# Patient Record
Sex: Male | Born: 1935
Health system: Southern US, Community
[De-identification: ages and names within clinical notes are randomized; demographics above are authoritative.]

## PROBLEM LIST (undated history)

## (undated) DIAGNOSIS — M1990 Unspecified osteoarthritis, unspecified site: Secondary | ICD-10-CM

## (undated) DIAGNOSIS — G4730 Sleep apnea, unspecified: Secondary | ICD-10-CM

## (undated) DIAGNOSIS — M545 Low back pain, unspecified: Secondary | ICD-10-CM

## (undated) DIAGNOSIS — R42 Dizziness and giddiness: Secondary | ICD-10-CM

## (undated) DIAGNOSIS — I10 Essential (primary) hypertension: Secondary | ICD-10-CM

## (undated) DIAGNOSIS — I639 Cerebral infarction, unspecified: Secondary | ICD-10-CM

## (undated) DIAGNOSIS — G8929 Other chronic pain: Secondary | ICD-10-CM

## (undated) DIAGNOSIS — K219 Gastro-esophageal reflux disease without esophagitis: Secondary | ICD-10-CM

## (undated) DIAGNOSIS — E7800 Pure hypercholesterolemia, unspecified: Secondary | ICD-10-CM

## (undated) DIAGNOSIS — H9190 Unspecified hearing loss, unspecified ear: Secondary | ICD-10-CM

## (undated) DIAGNOSIS — N400 Enlarged prostate without lower urinary tract symptoms: Secondary | ICD-10-CM

## (undated) DIAGNOSIS — Z87442 Personal history of urinary calculi: Secondary | ICD-10-CM

## (undated) HISTORY — DX: Pure hypercholesterolemia, unspecified: E78.00

## (undated) HISTORY — PX: LITHOTRIPSY: SUR834

## (undated) HISTORY — DX: Unspecified hearing loss, unspecified ear: H91.90

## (undated) HISTORY — DX: Gastro-esophageal reflux disease without esophagitis: K21.9

## (undated) HISTORY — DX: Dizziness and giddiness: R42

## (undated) HISTORY — PX: STOMACH SURGERY: SHX791

## (undated) HISTORY — DX: Benign prostatic hyperplasia without lower urinary tract symptoms: N40.0

---
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
## 1977-11-22 DIAGNOSIS — I639 Cerebral infarction, unspecified: Secondary | ICD-10-CM

## 1977-11-22 HISTORY — PX: REPAIR OF PERFORATED ULCER: SHX6065

## 1977-11-22 HISTORY — DX: Cerebral infarction, unspecified: I63.9

## 2002-11-22 HISTORY — PX: NASAL SINUS SURGERY: SHX719

## 2009-11-22 HISTORY — PX: BACK SURGERY: SHX140

## 2009-11-22 HISTORY — PX: POSTERIOR LAMINECTOMY / DECOMPRESSION LUMBAR SPINE: SUR740

## 2010-01-14 ENCOUNTER — Encounter: Admission: RE | Admit: 2010-01-14 | Discharge: 2010-01-14 | Payer: Self-pay | Admitting: Orthopedic Surgery

## 2010-04-15 ENCOUNTER — Ambulatory Visit (HOSPITAL_COMMUNITY): Admission: RE | Admit: 2010-04-15 | Discharge: 2010-04-16 | Payer: Self-pay | Admitting: Orthopedic Surgery

## 2010-04-15 HISTORY — PX: CARPAL TUNNEL RELEASE: SHX101

## 2011-02-08 LAB — POCT I-STAT 4, (NA,K, GLUC, HGB,HCT)
Glucose, Bld: 97 mg/dL (ref 70–99)
HCT: 36 % — ABNORMAL LOW (ref 39.0–52.0)
Hemoglobin: 12.2 g/dL — ABNORMAL LOW (ref 13.0–17.0)
Potassium: 4.2 mEq/L (ref 3.5–5.1)
Sodium: 138 mEq/L (ref 135–145)

## 2011-09-17 ENCOUNTER — Other Ambulatory Visit: Payer: Self-pay | Admitting: Orthopedic Surgery

## 2011-09-17 DIAGNOSIS — M48061 Spinal stenosis, lumbar region without neurogenic claudication: Secondary | ICD-10-CM

## 2011-09-30 ENCOUNTER — Ambulatory Visit
Admission: RE | Admit: 2011-09-30 | Discharge: 2011-09-30 | Disposition: A | Discharge status: Home / Self Care | Payer: Medicare Other | Source: Ambulatory Visit | Attending: Orthopedic Surgery | Admitting: Orthopedic Surgery

## 2011-09-30 DIAGNOSIS — M48061 Spinal stenosis, lumbar region without neurogenic claudication: Secondary | ICD-10-CM

## 2011-09-30 MED ORDER — IOHEXOL 180 MG/ML  SOLN
15.0000 mL | Freq: Once | INTRAMUSCULAR | Status: AC | PRN
  Administered 2011-09-30: 15 mL via INTRAVENOUS

## 2011-09-30 MED ORDER — DIAZEPAM 5 MG PO TABS
5.0000 mg | ORAL_TABLET | Freq: Once | ORAL | Status: AC
  Administered 2011-09-30: 5 mg via ORAL

## 2011-09-30 MED ORDER — ONDANSETRON HCL 4 MG/2ML IJ SOLN: 4.0000 mg | Freq: Four times a day (QID) | INTRAMUSCULAR | Status: DC | PRN

## 2011-09-30 MED ORDER — OXYCODONE-ACETAMINOPHEN 5-325 MG PO TABS
1.0000 | ORAL_TABLET | Freq: Once | ORAL | Status: AC
  Administered 2011-09-30: 1 via ORAL

## 2011-09-30 NOTE — Patient Instructions (Signed)

## 2013-04-28 DIAGNOSIS — M5137 Other intervertebral disc degeneration, lumbosacral region: Secondary | ICD-10-CM | POA: Insufficient documentation

## 2013-04-28 DIAGNOSIS — M51379 Other intervertebral disc degeneration, lumbosacral region without mention of lumbar back pain or lower extremity pain: Secondary | ICD-10-CM

## 2013-04-28 HISTORY — DX: Other intervertebral disc degeneration, lumbosacral region without mention of lumbar back pain or lower extremity pain: M51.379

## 2013-05-02 ENCOUNTER — Encounter (HOSPITAL_COMMUNITY): Payer: Self-pay | Admitting: Pharmacy Technician

## 2013-05-08 ENCOUNTER — Encounter (HOSPITAL_COMMUNITY): Payer: Self-pay

## 2013-05-08 ENCOUNTER — Encounter (HOSPITAL_COMMUNITY)
Admission: RE | Admit: 2013-05-08 | Discharge: 2013-05-08 | Disposition: A | Discharge status: Home / Self Care | Payer: Medicare Other | Source: Ambulatory Visit | Attending: Orthopedic Surgery | Admitting: Orthopedic Surgery

## 2013-05-08 HISTORY — DX: Unspecified osteoarthritis, unspecified site: M19.90

## 2013-05-08 HISTORY — DX: Cerebral infarction, unspecified: I63.9

## 2013-05-08 HISTORY — DX: Essential (primary) hypertension: I10

## 2013-05-08 LAB — CBC
HCT: 41.2 % (ref 39.0–52.0)
Hemoglobin: 14.1 g/dL (ref 13.0–17.0)
MCV: 88.8 fL (ref 78.0–100.0)
RDW: 14 % (ref 11.5–15.5)
WBC: 6.8 10*3/uL (ref 4.0–10.5)

## 2013-05-08 LAB — SURGICAL PCR SCREEN: Staphylococcus aureus: NEGATIVE

## 2013-05-08 LAB — BASIC METABOLIC PANEL
BUN: 17 mg/dL (ref 6–23)
CO2: 22 mEq/L (ref 19–32)
Chloride: 106 mEq/L (ref 96–112)
Creatinine, Ser: 0.85 mg/dL (ref 0.50–1.35)

## 2013-05-08 NOTE — Pre-Procedure Instructions (Signed)
KHOLE BRANCH  05/08/2013   Your procedure is scheduled on:  Thursday May 10, 2013  Report to [facility] at 1230 PM.  Call this number if you have problems the morning of surgery: [phone]   Remember:   Do not eat food or drink liquids after midnight Wednesday   Take these medicines the morning of surgery with A SIP OF WATER: Amlodipine, and Metoprolol,   Do not wear jewelry.  Do not wear lotions, powders, or perfumes. You may wear deodorant.  Do not shave 48 hours prior to surgery. Men may shave face and neck.  Do not bring valuables to the hospital.  [facility] is not responsible                   for any belongings or valuables.  Contacts, dentures or bridgework may not be worn into surgery.  Leave suitcase in the car. After surgery it may be brought to your room.  For patients admitted to the hospital, checkout time is 11:00 AM the day of  discharge.   Patients discharged the day of surgery will not be allowed to drive  home.    Special Instructions: Shower using CHG 2 nights before surgery and the night before surgery.  If you shower the day of surgery use CHG.  Use special wash - you have one bottle of CHG for all showers.  You should use approximately 1/3 of the bottle for each shower.   Please read over the following fact sheets that you were given: Pain Booklet, Coughing and Deep Breathing, MRSA Information and Surgical Site Infection Prevention

## 2013-05-09 ENCOUNTER — Other Ambulatory Visit: Payer: Self-pay | Admitting: Orthopedic Surgery

## 2013-05-09 NOTE — Progress Notes (Signed)
Called Dr. Laverta Baltimore office, spoke with [facility].  Orders requested to be placed in EPIC.

## 2013-05-10 ENCOUNTER — Encounter (HOSPITAL_COMMUNITY): Payer: Self-pay | Admitting: *Deleted

## 2013-05-10 ENCOUNTER — Encounter (HOSPITAL_COMMUNITY): Payer: Self-pay | Admitting: Anesthesiology

## 2013-05-10 ENCOUNTER — Ambulatory Visit (HOSPITAL_COMMUNITY): Payer: Medicare Other | Admitting: Anesthesiology

## 2013-05-10 ENCOUNTER — Encounter (HOSPITAL_COMMUNITY)
Admission: RE | Disposition: A | Discharge status: Home / Self Care | Payer: Self-pay | Source: Ambulatory Visit | Attending: Orthopedic Surgery

## 2013-05-10 ENCOUNTER — Inpatient Hospital Stay (HOSPITAL_COMMUNITY)
Admission: RE | Admit: 2013-05-10 | Discharge: 2013-05-12 | DRG: 494 | Disposition: A | Discharge status: Home / Self Care | Payer: Medicare Other | Source: Ambulatory Visit | Attending: Orthopedic Surgery | Admitting: Orthopedic Surgery

## 2013-05-10 ENCOUNTER — Ambulatory Visit (HOSPITAL_COMMUNITY): Payer: Medicare Other

## 2013-05-10 DIAGNOSIS — I10 Essential (primary) hypertension: Secondary | ICD-10-CM | POA: Diagnosis present

## 2013-05-10 DIAGNOSIS — Z8673 Personal history of transient ischemic attack (TIA), and cerebral infarction without residual deficits: Secondary | ICD-10-CM

## 2013-05-10 DIAGNOSIS — M19079 Primary osteoarthritis, unspecified ankle and foot: Principal | ICD-10-CM | POA: Diagnosis present

## 2013-05-10 DIAGNOSIS — Z01810 Encounter for preprocedural cardiovascular examination: Secondary | ICD-10-CM

## 2013-05-10 DIAGNOSIS — Z01818 Encounter for other preprocedural examination: Secondary | ICD-10-CM

## 2013-05-10 DIAGNOSIS — Z87891 Personal history of nicotine dependence: Secondary | ICD-10-CM

## 2013-05-10 DIAGNOSIS — M19071 Primary osteoarthritis, right ankle and foot: Secondary | ICD-10-CM

## 2013-05-10 DIAGNOSIS — Z01812 Encounter for preprocedural laboratory examination: Secondary | ICD-10-CM

## 2013-05-10 HISTORY — DX: Sleep apnea, unspecified: G47.30

## 2013-05-10 HISTORY — DX: Low back pain: M54.5

## 2013-05-10 HISTORY — PX: ANKLE FUSION: SHX881

## 2013-05-10 HISTORY — PX: TENDON RELEASE: SHX230

## 2013-05-10 HISTORY — DX: Other chronic pain: G89.29

## 2013-05-10 HISTORY — DX: Low back pain, unspecified: M54.50

## 2013-05-10 LAB — CREATININE, SERUM
Creatinine, Ser: 0.87 mg/dL (ref 0.50–1.35)
GFR calc Af Amer: >90 mL/min (ref >90)
GFR calc non Af Amer: 81 mL/min — ABNORMAL LOW (ref >90)

## 2013-05-10 LAB — CBC
HCT: 40.6 % (ref 39.0–52.0)
Platelets: 226 10*3/uL (ref 150–400)
RDW: 13.8 % (ref 11.5–15.5)
WBC: 8.2 10*3/uL (ref 4.0–10.5)

## 2013-05-10 SURGERY — ARTHRODESIS ANKLE
Anesthesia: General | Site: Ankle | Laterality: Right | Wound class: Clean

## 2013-05-10 MED ORDER — PNEUMOCOCCAL VAC POLYVALENT 25 MCG/0.5ML IJ INJ
0.5000 mL | INJECTION | INTRAMUSCULAR | Status: DC
  Filled 2013-05-10: qty 0.5

## 2013-05-10 MED ORDER — EPHEDRINE SULFATE 50 MG/ML IJ SOLN
INTRAMUSCULAR | Status: DC | PRN
  Administered 2013-05-10 (×4): 10 mg via INTRAVENOUS

## 2013-05-10 MED ORDER — MEPERIDINE HCL 25 MG/ML IJ SOLN: 6.2500 mg | INTRAMUSCULAR | Status: DC | PRN

## 2013-05-10 MED ORDER — PROPOFOL 10 MG/ML IV BOLUS
INTRAVENOUS | Status: DC | PRN
  Administered 2013-05-10: 150 mg via INTRAVENOUS

## 2013-05-10 MED ORDER — ONDANSETRON HCL 4 MG/2ML IJ SOLN
INTRAMUSCULAR | Status: DC | PRN
  Administered 2013-05-10: 4 mg via INTRAVENOUS

## 2013-05-10 MED ORDER — CEFAZOLIN SODIUM-DEXTROSE 2-3 GM-% IV SOLR
INTRAVENOUS | Status: AC
  Administered 2013-05-10: 2 g via INTRAVENOUS
  Filled 2013-05-10: qty 50

## 2013-05-10 MED ORDER — BACITRACIN ZINC 500 UNIT/GM EX OINT
TOPICAL_OINTMENT | CUTANEOUS | Status: AC
  Filled 2013-05-10: qty 15

## 2013-05-10 MED ORDER — ARTIFICIAL TEARS OP OINT
TOPICAL_OINTMENT | OPHTHALMIC | Status: DC | PRN
  Administered 2013-05-10: 1 via OPHTHALMIC

## 2013-05-10 MED ORDER — ONDANSETRON HCL 4 MG PO TABS: 4.0000 mg | ORAL_TABLET | Freq: Four times a day (QID) | ORAL | Status: DC | PRN

## 2013-05-10 MED ORDER — OXYCODONE HCL 5 MG PO TABS: 5.0000 mg | ORAL_TABLET | Freq: Once | ORAL | Status: DC | PRN

## 2013-05-10 MED ORDER — LIDOCAINE HCL (CARDIAC) 20 MG/ML IV SOLN
INTRAVENOUS | Status: DC | PRN
  Administered 2013-05-10: 80 mg via INTRAVENOUS

## 2013-05-10 MED ORDER — ONDANSETRON HCL 4 MG/2ML IJ SOLN: 4.0000 mg | Freq: Four times a day (QID) | INTRAMUSCULAR | Status: DC | PRN

## 2013-05-10 MED ORDER — MIDAZOLAM HCL 2 MG/2ML IJ SOLN
INTRAMUSCULAR | Status: AC
  Administered 2013-05-10: 2 mg
  Filled 2013-05-10: qty 2

## 2013-05-10 MED ORDER — MORPHINE SULFATE 2 MG/ML IJ SOLN: 1.0000 mg | INTRAMUSCULAR | Status: DC | PRN

## 2013-05-10 MED ORDER — AMLODIPINE BESYLATE 5 MG PO TABS
5.0000 mg | ORAL_TABLET | Freq: Every day | ORAL | Status: DC
  Administered 2013-05-11 – 2013-05-12 (×2): 5 mg via ORAL
  Filled 2013-05-10 (×3): qty 1

## 2013-05-10 MED ORDER — SENNA 8.6 MG PO TABS
2.0000 | ORAL_TABLET | Freq: Two times a day (BID) | ORAL | Status: DC
  Administered 2013-05-11 – 2013-05-12 (×3): 17.2 mg via ORAL
  Filled 2013-05-10 (×5): qty 2

## 2013-05-10 MED ORDER — HYDROMORPHONE HCL PF 1 MG/ML IJ SOLN: 0.2500 mg | INTRAMUSCULAR | Status: DC | PRN

## 2013-05-10 MED ORDER — OXYCODONE HCL 5 MG PO TABS
5.0000 mg | ORAL_TABLET | ORAL | Status: DC | PRN
  Administered 2013-05-10: 10 mg via ORAL
  Administered 2013-05-11: 5 mg via ORAL
  Administered 2013-05-11 – 2013-05-12 (×3): 10 mg via ORAL
  Filled 2013-05-10 (×4): qty 2

## 2013-05-10 MED ORDER — ADULT MULTIVITAMIN W/MINERALS CH
1.0000 | ORAL_TABLET | Freq: Every day | ORAL | Status: DC
  Administered 2013-05-10 – 2013-05-12 (×3): 1 via ORAL
  Filled 2013-05-10 (×3): qty 1

## 2013-05-10 MED ORDER — BACITRACIN ZINC 500 UNIT/GM EX OINT
TOPICAL_OINTMENT | CUTANEOUS | Status: DC | PRN
  Administered 2013-05-10: 1 via TOPICAL

## 2013-05-10 MED ORDER — OXYCODONE HCL 5 MG/5ML PO SOLN: 5.0000 mg | Freq: Once | ORAL | Status: DC | PRN

## 2013-05-10 MED ORDER — FENTANYL CITRATE 0.05 MG/ML IJ SOLN
INTRAMUSCULAR | Status: DC | PRN
  Administered 2013-05-10: 25 ug via INTRAVENOUS

## 2013-05-10 MED ORDER — LACTATED RINGERS IV SOLN
INTRAVENOUS | Status: DC
  Administered 2013-05-10 (×3): via INTRAVENOUS

## 2013-05-10 MED ORDER — LOSARTAN POTASSIUM 50 MG PO TABS
100.0000 mg | ORAL_TABLET | Freq: Every day | ORAL | Status: DC
  Administered 2013-05-10 – 2013-05-11 (×2): 100 mg via ORAL
  Filled 2013-05-10 (×3): qty 2

## 2013-05-10 MED ORDER — CHLORHEXIDINE GLUCONATE 4 % EX LIQD: 60.0000 mL | Freq: Once | CUTANEOUS | Status: DC

## 2013-05-10 MED ORDER — SODIUM CHLORIDE 0.9 % IR SOLN
Status: DC | PRN
  Administered 2013-05-10: 1000 mL

## 2013-05-10 MED ORDER — PROMETHAZINE HCL 25 MG/ML IJ SOLN: 6.2500 mg | INTRAMUSCULAR | Status: DC | PRN

## 2013-05-10 MED ORDER — DOCUSATE SODIUM 100 MG PO CAPS
100.0000 mg | ORAL_CAPSULE | Freq: Two times a day (BID) | ORAL | Status: DC
  Administered 2013-05-10 – 2013-05-12 (×4): 100 mg via ORAL
  Filled 2013-05-10 (×6): qty 1

## 2013-05-10 MED ORDER — ROPIVACAINE HCL 5 MG/ML IJ SOLN
INTRAMUSCULAR | Status: DC | PRN
  Administered 2013-05-10: 30 mL

## 2013-05-10 MED ORDER — SODIUM CHLORIDE 0.9 % IV SOLN: INTRAVENOUS | Status: DC

## 2013-05-10 MED ORDER — METOPROLOL TARTRATE 100 MG PO TABS
100.0000 mg | ORAL_TABLET | Freq: Two times a day (BID) | ORAL | Status: DC
  Administered 2013-05-10 – 2013-05-12 (×4): 100 mg via ORAL
  Filled 2013-05-10 (×5): qty 1

## 2013-05-10 MED ORDER — FENTANYL CITRATE 0.05 MG/ML IJ SOLN
INTRAMUSCULAR | Status: AC
  Administered 2013-05-10: 100 ug
  Filled 2013-05-10: qty 2

## 2013-05-10 MED ORDER — PHENYLEPHRINE HCL 10 MG/ML IJ SOLN
INTRAMUSCULAR | Status: DC | PRN
  Administered 2013-05-10 (×2): 80 ug via INTRAVENOUS
  Administered 2013-05-10: 120 ug via INTRAVENOUS
  Administered 2013-05-10: 60 ug via INTRAVENOUS
  Administered 2013-05-10: 80 ug via INTRAVENOUS
  Administered 2013-05-10: 60 ug via INTRAVENOUS
  Administered 2013-05-10 (×4): 80 ug via INTRAVENOUS

## 2013-05-10 MED ORDER — SODIUM CHLORIDE 0.9 % IV SOLN
INTRAVENOUS | Status: DC
  Administered 2013-05-10: 19:00:00 via INTRAVENOUS

## 2013-05-10 MED ORDER — CEFAZOLIN SODIUM-DEXTROSE 2-3 GM-% IV SOLR: 2.0000 g | INTRAVENOUS | Status: DC

## 2013-05-10 MED ORDER — ENOXAPARIN SODIUM 40 MG/0.4ML ~~LOC~~ SOLN
40.0000 mg | SUBCUTANEOUS | Status: DC
  Administered 2013-05-11 – 2013-05-12 (×2): 40 mg via SUBCUTANEOUS
  Filled 2013-05-10 (×4): qty 0.4

## 2013-05-10 SURGICAL SUPPLY — 75 items
BANDAGE ESMARK 6X9 LF (GAUZE/BANDAGES/DRESSINGS) ×1 IMPLANT
BIT DRILL 2.5X2.75 QC CALB (BIT) ×1 IMPLANT
BIT DRILL 3.5X5.5 QC CALB (BIT) ×1 IMPLANT
BIT DRILL 5 ACE CANN QC (BIT) ×1 IMPLANT
BLADE SAW SGTL HD 18.5X60.5X1. (BLADE) ×2 IMPLANT
BLADE SURG 10 STRL SS (BLADE) ×2 IMPLANT
BNDG CMPR 9X6 STRL LF SNTH (GAUZE/BANDAGES/DRESSINGS) ×1
BNDG COHESIVE 4X5 TAN STRL (GAUZE/BANDAGES/DRESSINGS) ×3 IMPLANT
BNDG COHESIVE 6X5 TAN STRL LF (GAUZE/BANDAGES/DRESSINGS) ×1 IMPLANT
BNDG ESMARK 6X9 LF (GAUZE/BANDAGES/DRESSINGS) ×2
CHLORAPREP W/TINT 10.5 ML (MISCELLANEOUS) ×2 IMPLANT
CHLORAPREP W/TINT 26ML (MISCELLANEOUS) ×2 IMPLANT
CLOTH BEACON ORANGE TIMEOUT ST (SAFETY) ×2 IMPLANT
COVER SURGICAL LIGHT HANDLE (MISCELLANEOUS) ×3 IMPLANT
CUFF TOURNIQUET SINGLE 34IN LL (TOURNIQUET CUFF) ×2 IMPLANT
CUFF TOURNIQUET SINGLE 44IN (TOURNIQUET CUFF) IMPLANT
DRAPE C-ARM 42X72 X-RAY (DRAPES) ×2 IMPLANT
DRAPE C-ARMOR (DRAPES) ×2 IMPLANT
DRAPE INCISE IOBAN 66X45 STRL (DRAPES) ×2 IMPLANT
DRAPE U-SHAPE 47X51 STRL (DRAPES) ×2 IMPLANT
DRSG ADAPTIC 3X8 NADH LF (GAUZE/BANDAGES/DRESSINGS) ×1 IMPLANT
DRSG PAD ABDOMINAL 8X10 ST (GAUZE/BANDAGES/DRESSINGS) ×2 IMPLANT
DRSG TEGADERM 4X4.75 (GAUZE/BANDAGES/DRESSINGS) ×1 IMPLANT
ELECT REM PT RETURN 9FT ADLT (ELECTROSURGICAL) ×2
ELECTRODE REM PT RTRN 9FT ADLT (ELECTROSURGICAL) ×1 IMPLANT
GAUZE SPONGE 2X2 8PLY STRL LF (GAUZE/BANDAGES/DRESSINGS) ×1 IMPLANT
GAUZE XEROFORM 5X9 LF (GAUZE/BANDAGES/DRESSINGS) ×1 IMPLANT
GLOVE BIO SURGEON STRL SZ8 (GLOVE) ×2 IMPLANT
GLOVE BIOGEL PI IND STRL 8 (GLOVE) ×1 IMPLANT
GLOVE BIOGEL PI INDICATOR 8 (GLOVE) ×1
GLOVE SS BIOGEL STRL SZ 6 (GLOVE) IMPLANT
GLOVE SUPERSENSE BIOGEL SZ 6 (GLOVE) ×1
GLOVE SURG SS PI 7.5 STRL IVOR (GLOVE) ×1 IMPLANT
GOWN PREVENTION PLUS XLARGE (GOWN DISPOSABLE) ×2 IMPLANT
GOWN STRL NON-REIN LRG LVL3 (GOWN DISPOSABLE) ×4 IMPLANT
GOWN STRL REIN 2XL XLG LVL4 (GOWN DISPOSABLE) ×1 IMPLANT
KIT BASIN OR (CUSTOM PROCEDURE TRAY) ×2 IMPLANT
KIT ROOM TURNOVER OR (KITS) ×2 IMPLANT
MANIFOLD NEPTUNE II (INSTRUMENTS) ×2 IMPLANT
NEEDLE 22X1 1/2 (OR ONLY) (NEEDLE) IMPLANT
NS IRRIG 1000ML POUR BTL (IV SOLUTION) ×2 IMPLANT
PACK ORTHO EXTREMITY (CUSTOM PROCEDURE TRAY) ×2 IMPLANT
PAD ARMBOARD 7.5X6 YLW CONV (MISCELLANEOUS) ×4 IMPLANT
PAD CAST 4YDX4 CTTN HI CHSV (CAST SUPPLIES) ×1 IMPLANT
PADDING CAST COTTON 4X4 STRL (CAST SUPPLIES) ×4
PADDING CAST COTTON 6X4 STRL (CAST SUPPLIES) ×1 IMPLANT
PIN THREADED GUIDE ACE (PIN) ×3 IMPLANT
PUTTY DBM STAGRAFT 5CC (Putty) ×1 IMPLANT
SCREW CANN 8.0 55MM (Screw) ×1 IMPLANT
SCREW CANN 8.0 60MM (Screw) ×2 IMPLANT
SCREW CORTICAL 3.5MM  55MM (Screw) ×1 IMPLANT
SCREW CORTICAL 3.5MM 50MM (Screw) ×1 IMPLANT
SCREW CORTICAL 3.5MM 55MM (Screw) IMPLANT
SOAP 2 % CHG 4 OZ (WOUND CARE) ×2 IMPLANT
SPLINT PLASTER CAST XFAST 5X30 (CAST SUPPLIES) IMPLANT
SPLINT PLASTER XFAST SET 5X30 (CAST SUPPLIES) ×1
SPONGE GAUZE 2X2 STER 10/PKG (GAUZE/BANDAGES/DRESSINGS)
SPONGE GAUZE 4X4 12PLY (GAUZE/BANDAGES/DRESSINGS) ×1 IMPLANT
SPONGE LAP 18X18 X RAY DECT (DISPOSABLE) ×2 IMPLANT
STAPLER VISISTAT 35W (STAPLE) IMPLANT
STRIP CLOSURE SKIN 1/2X4 (GAUZE/BANDAGES/DRESSINGS) ×1 IMPLANT
SUCTION FRAZIER TIP 10 FR DISP (SUCTIONS) ×2 IMPLANT
SUT PROLENE 3 0 PS 2 (SUTURE) ×2 IMPLANT
SUT VIC AB 0 CT1 27 (SUTURE) ×2
SUT VIC AB 0 CT1 27XBRD ANBCTR (SUTURE) IMPLANT
SUT VIC AB 2-0 CT1 27 (SUTURE) ×4
SUT VIC AB 2-0 CT1 TAPERPNT 27 (SUTURE) ×2 IMPLANT
SUT VIC AB 3-0 PS2 18 (SUTURE) ×2
SUT VIC AB 3-0 PS2 18XBRD (SUTURE) ×1 IMPLANT
SYR CONTROL 10ML LL (SYRINGE) IMPLANT
TOWEL OR 17X24 6PK STRL BLUE (TOWEL DISPOSABLE) ×2 IMPLANT
TOWEL OR 17X26 10 PK STRL BLUE (TOWEL DISPOSABLE) ×2 IMPLANT
TUBE CONNECTING 12X1/4 (SUCTIONS) ×2 IMPLANT
WASHER CUP TIMAX (Trauma) ×1 IMPLANT
WATER STERILE IRR 1000ML POUR (IV SOLUTION) ×1 IMPLANT

## 2013-05-10 NOTE — Anesthesia Procedure Notes (Addendum)
Anesthesia Regional Block:  Popliteal block  Pre-Anesthetic Checklist: ,, timeout performed, Correct Patient, Correct Site, Correct Laterality, Correct Procedure, Correct Position, site marked, Risks and benefits discussed, pre-op evaluation,  At surgeon's request and post-op pain management  Laterality: Lower and Right  Prep: chloraprep       Needles:  Injection technique: Single-shot  Needle Type: Echogenic Needle      Needle Gauge: 22 and 22 G  Needle insertion depth: 6 cm   Additional Needles:  Procedures: ultrasound guided (picture in chart) and nerve stimulator Popliteal block  Nerve Stimulator or Paresthesia:  Response: Twitch elicited, 0.8 mA, 0.3 ms, 6 cm  Additional Responses:   Narrative:  Start time: 05/10/2013 2:05 PM End time: 05/10/2013 2:20 PM Injection made incrementally with aspirations every 40 mL.  Performed by: Personally  Anesthesiologist: Oren Section, MD  Additional Notes: This is a lateral approach to the popliteal fossa area. A stimulator needle is used starting at 1.5 mAmp current and descending as nerve contact is made. Injection of anesthetic is in 5 ml increments with multiple neg aspirations before continuing. Total volume is 40 cc.    Popliteal block Procedure Name: LMA Insertion Date/Time: 05/10/2013 2:33 PM Performed by: Jefm Miles E Pre-anesthesia Checklist: Patient identified, Emergency Drugs available, Suction available and Patient being monitored Patient Re-evaluated:Patient Re-evaluated prior to inductionOxygen Delivery Method: Circle system utilized Preoxygenation: Pre-oxygenation with 100% oxygen Intubation Type: IV induction Ventilation: Mask ventilation without difficulty LMA: LMA inserted LMA Size: 4.0 Placement Confirmation: positive ETCO2 and breath sounds checked- equal and bilateral Tube secured with: Tape Dental Injury: Teeth and Oropharynx as per pre-operative assessment

## 2013-05-10 NOTE — Brief Op Note (Signed)
05/10/2013  5:01 PM  PATIENT:  [name]  79 y.o. male  PRE-OPERATIVE DIAGNOSIS:  RIGHT ANKLE ARTHRITIS  POST-OPERATIVE DIAGNOSIS:  RIGHT ANKLE ARTHRITIS  Procedure(s): Right ankle arthrodesis  SURGEON:  Toni Arthurs, MD  ASSISTANT: n/a  ANESTHESIA:   General, regional  EBL:  minimal   TOURNIQUET:   Total Tourniquet Time Documented: Thigh (Right) - 111 minutes Total: Thigh (Right) - 111 minutes   COMPLICATIONS:  None apparent  DISPOSITION:  Extubated, awake and stable to recovery.  DICTATION ID:  [id number]

## 2013-05-10 NOTE — Preoperative (Signed)
Beta Blockers   Reason not to administer Beta Blockers:Not Applicable 

## 2013-05-10 NOTE — H&P (Signed)
[name] is an 79 y.o. male.   Chief Complaint:  Right ankle pain HPI:  79 y/o male with painful right ankle for many years.  xrays reveal right ankle valgus end stage arthritis.  He presents now for arthrodesis of the right ankle.  Past Medical History  Diagnosis Date  . Hypertension   . Stroke 1979  . Arthritis     Past Surgical History  Procedure Laterality Date  . Back surgery  2011    Dr. Simonne Come  . Nasal sinus surgery Bilateral   . Tonsillectomy    . Repair of perforated ulcer  1979  . Carpal tunnel release Right 1979    History reviewed. No pertinent family history. Social History:  reports that he quit smoking about 34 years ago. His smoking use included Cigarettes. He has a 25 pack-year smoking history. He has never used smokeless tobacco. He reports that  drinks alcohol. He reports that he does not use illicit drugs.  Allergies: No Known Allergies  Medications Prior to Admission  Medication Sig Dispense Refill  . amLODipine (NORVASC) 5 MG tablet Take 5 mg by mouth daily.      Marland Kitchen aspirin EC 81 MG tablet Take 81 mg by mouth daily.      Marland Kitchen losartan (COZAAR) 100 MG tablet Take 100 mg by mouth at bedtime.      . metoprolol (LOPRESSOR) 100 MG tablet Take 100 mg by mouth 2 (two) times daily.      . Multiple Vitamin (MULTIVITAMIN WITH MINERALS) TABS Take 1 tablet by mouth daily.        Results for orders placed during the hospital encounter of 05/08/13 (from the past 48 hour(s))  BASIC METABOLIC PANEL     Status: Abnormal   Collection Time    05/08/13  2:50 PM      Result Value Range   Sodium 138  135 - 145 mEq/L   Potassium 5.0  3.5 - 5.1 mEq/L   Comment: HEMOLYSIS AT THIS LEVEL MAY AFFECT RESULT     SLIGHT HEMOLYSIS   Chloride 106  96 - 112 mEq/L   CO2 22  19 - 32 mEq/L   Glucose, Bld 99  70 - 99 mg/dL   BUN 17  6 - 23 mg/dL   Creatinine, Ser 4.54  0.50 - 1.35 mg/dL   Calcium 9.4  8.4 - 09.8 mg/dL   GFR calc non Af Amer 82 (*) >90 mL/min   GFR calc Af Amer  >90  >90 mL/min   Comment:            The eGFR has been calculated     using the CKD EPI equation.     This calculation has not been     validated in all clinical     situations.     eGFR's persistently     <90 mL/min signify     possible Chronic Kidney Disease.  CBC     Status: None   Collection Time    05/08/13  2:50 PM      Result Value Range   WBC 6.8  4.0 - 10.5 K/uL   RBC 4.64  4.22 - 5.81 MIL/uL   Hemoglobin 14.1  13.0 - 17.0 g/dL   HCT 11.9  14.7 - 82.9 %   MCV 88.8  78.0 - 100.0 fL   MCH 30.4  26.0 - 34.0 pg   MCHC 34.2  30.0 - 36.0 g/dL   RDW 56.2  13.0 -  15.5 %   Platelets 233  150 - 400 K/uL  SURGICAL PCR SCREEN     Status: None   Collection Time    05/08/13  2:55 PM      Result Value Range   MRSA, PCR NEGATIVE  NEGATIVE   Staphylococcus aureus NEGATIVE  NEGATIVE   Comment:            The Xpert SA Assay (FDA     approved for NASAL specimens     in patients over 68 years of age),     is one component of     a comprehensive surveillance     program.  Test performance has     been validated by [employer] for patients greater     than or equal to 55 year old.     It is not intended     to diagnose infection nor to     guide or monitor treatment.   Dg Chest 2 View  05/08/2013   *RADIOLOGY REPORT*  Clinical Data: Ex-smoker.  Preoperative evaluation prior to right ankle surgery.  CHEST - 2 VIEW  Comparison: 04/03/2013.  Findings: Normal sized heart.  Tortuous aorta.  Clear lungs. Surgical clips in the region of the gastroesophageal junction. Mild scoliosis.  IMPRESSION: No acute abnormality.   Original Report Authenticated By: Beckie Salts, M.D.    ROS  No recent f/c/n/v/wt loss  Blood pressure 136/86, pulse 65, temperature 97.3 F (36.3 C), temperature source Oral, resp. rate 20, SpO2 97.00%. Physical Exam  wn wd male in nad.  A and O x 4.  Mood and affect normal.  EOMI.  Resp unlabored.  R ankle with valgus malalignment.  Skin healthy and intact.  Sens  to LT intact.  5/5 strength in PF and DF of the ankle and toes.  No lymphadenopathy.  + effusion.  No swelling.  Assessment/Plan Right ankle valgus end stage arthritis - to OR for ankle arthrodesis.  The risks and benefits of the alternative treatment options have been discussed in detail.  The patient wishes to proceed with surgery and specifically understands risks of bleeding, infection, nerve damage, blood clots, need for additional surgery, amputation and death.   Toni Arthurs 2013/06/01, 2:04 PM

## 2013-05-10 NOTE — Transfer of Care (Signed)
Immediate Anesthesia Transfer of Care Note  Patient: [name]  Procedure(s) Performed: Procedure(s): ARTHRODESIS ANKLE (Right) PERCUTANEOUS ACHILLES LENGTHENING (Right)  Patient Location: PACU  Anesthesia Type:General  Level of Consciousness: awake, alert  and oriented  Airway & Oxygen Therapy: Patient Spontanous Breathing and Patient connected to nasal cannula oxygen  Post-op Assessment: Report given to PACU RN  Post vital signs: Reviewed and stable  Complications: No apparent anesthesia complications

## 2013-05-10 NOTE — Anesthesia Preprocedure Evaluation (Addendum)
Anesthesia Evaluation  Patient identified by MRN, date of birth, ID band Patient awake    Reviewed: Allergy & Precautions, H&P , NPO status , Patient's Chart, lab work & pertinent test results, reviewed documented beta blocker date and time   History of Anesthesia Complications Negative for: history of anesthetic complications  Airway Mallampati: II  Neck ROM: Full    Dental  (+) Teeth Intact and Dental Advisory Given   Pulmonary neg pulmonary ROS,  breath sounds clear to auscultation        Cardiovascular hypertension, Pt. on home beta blockers and Pt. on medications Rhythm:Regular Rate:Normal     Neuro/Psych CVA (35 years ago), No Residual Symptoms    GI/Hepatic negative GI ROS, Neg liver ROS,   Endo/Other  negative endocrine ROS  Renal/GU negative Renal ROS     Musculoskeletal   Abdominal   Peds  Hematology negative hematology ROS (+)   Anesthesia Other Findings   Reproductive/Obstetrics                         Anesthesia Physical Anesthesia Plan  ASA: II  Anesthesia Plan: General   Post-op Pain Management:    Induction:   Airway Management Planned: LMA  Additional Equipment:   Intra-op Plan:   Post-operative Plan: Extubation in OR  Informed Consent: I have reviewed the patients History and Physical, chart, labs and discussed the procedure including the risks, benefits and alternatives for the proposed anesthesia with the patient or authorized representative who has indicated his/her understanding and acceptance.   Dental advisory given  Plan Discussed with: CRNA and Surgeon  Anesthesia Plan Comments:         Anesthesia Quick Evaluation

## 2013-05-10 NOTE — Anesthesia Postprocedure Evaluation (Signed)
  Anesthesia Post-op Note  Patient: [name]  Procedure(s) Performed: Procedure(s): ARTHRODESIS ANKLE (Right) PERCUTANEOUS ACHILLES LENGTHENING (Right)  Patient Location: PACU  Anesthesia Type:GA combined with regional for post-op pain  Level of Consciousness: awake and sedated  Airway and Oxygen Therapy: Patient Spontanous Breathing  Post-op Pain: mild  Post-op Assessment: Post-op Vital signs reviewed  Post-op Vital Signs: stable  Complications: No apparent anesthesia complications

## 2013-05-11 ENCOUNTER — Encounter (HOSPITAL_COMMUNITY): Payer: Self-pay | Admitting: Orthopedic Surgery

## 2013-05-11 MED ORDER — OXYCODONE HCL 5 MG PO TABS: 5.0000 mg | ORAL_TABLET | ORAL | Status: DC | PRN

## 2013-05-11 NOTE — Evaluation (Signed)
Physical Therapy Evaluation Patient Details Name: [name] MRN: [id number] DOB: 03-Mar-1933 Today's Date: 05/11/2013 Time: 1191-4782 PT Time Calculation (min): 21 min  PT Assessment / Plan / Recommendation Clinical Impression  pt s/p arthrodesis R ankle and will benefit from PT to allow independence to return home with caregiver assist prn     PT Assessment  Patient needs continued PT services    Follow Up Recommendations  Home health PT    Does the patient have the potential to tolerate intense rehabilitation      Barriers to Discharge        Equipment Recommendations  Rolling walker with 5" wheels (pt also has script for knee walker)    Recommendations for Other Services     Frequency  (1-2 more visits)    Precautions / Restrictions Precautions Precautions: Fall Restrictions RLE Weight Bearing: Non weight bearing   Pertinent Vitals/Pain Denies pain      Mobility  Bed Mobility Bed Mobility: Supine to Sit Supine to Sit: 4: Min guard Details for Bed Mobility Assistance: cues for use of UEs  Transfers Transfers: Sit to Stand;Stand to Sit Sit to Stand: 4: Min assist Stand to Sit: 4: Min assist Details for Transfer Assistance: cues for safety, hand placement and RLE position/NWB Ambulation/Gait Ambulation/Gait Assistance: 4: Min assist Ambulation Distance (Feet): 30 Feet (in room) Assistive device: Rolling walker Ambulation/Gait Assistance Details: cues for RW safety, NWB and wt shift to UEs General Gait Details: fatigues quickly    Exercises     PT Diagnosis: Difficulty walking  PT Problem List: Decreased strength;Decreased range of motion;Decreased activity tolerance;Decreased balance;Decreased mobility;Decreased knowledge of use of DME PT Treatment Interventions: DME instruction;Gait training;Functional mobility training;Balance training;Stair training;Patient/family education   PT Goals Acute Rehab PT Goals PT Goal Formulation: With patient Time For  Goal Achievement: 05/12/13 Potential to Achieve Goals: Good Pt will go Sit to Stand: with supervision PT Goal: Sit to Stand - Progress: Goal set today Pt will go Stand to Sit: with supervision PT Goal: Stand to Sit - Progress: Goal set today Pt will Ambulate: 16 - 50 feet;with min assist;with rolling walker PT Goal: Ambulate - Progress: Goal set today Pt will Go Up / Down Stairs: 1-2 stairs;with min assist;with rail(s);with least restrictive assistive device PT Goal: Up/Down Stairs - Progress: Goal set today  Visit Information  Last PT Received On: 05/11/13 Assistance Needed: +1    Subjective Data  Subjective: i hope i can go home   Prior Functioning  Home Living Lives With: Alone Available Help at Discharge: Available 24 hours/day Type of Home: House Home Access: Stairs to enter [employer] of Steps: 2 Entrance Stairs-Rails: Right Home Layout: One level Home Adaptive Equipment: Walker - standard;Crutches Additional Comments: walker was given to pt Prior Function Level of Independence: Independent Able to Take Stairs?: Yes Driving: Yes Communication Communication: No difficulties    Cognition  Cognition Arousal/Alertness: Awake/alert Behavior During Therapy: WFL for tasks assessed/performed Overall Cognitive Status: Within Functional Limits for tasks assessed    Extremity/Trunk Assessment Right Upper Extremity Assessment RUE ROM/Strength/Tone: [facility] for tasks assessed Left Upper Extremity Assessment LUE ROM/Strength/Tone: WFL for tasks assessed Right Lower Extremity Assessment RLE ROM/Strength/Tone: Deficits;Due to precautions RLE ROM/Strength/Tone Deficits: knee and hip grossly 3/5 and AROM WFL for tasks assessed Left Lower Extremity Assessment LLE ROM/Strength/Tone: WFL for tasks assessed   Balance Static Standing Balance Static Standing - Balance Support: Bilateral upper extremity supported Static Standing - Level of Assistance: 4: Min assist;5: Stand  by assistance  End of Session PT - End of Session Equipment Utilized During Treatment: Gait belt Activity Tolerance: Patient tolerated treatment well Patient left: in chair;with call bell/phone within reach  GP     [facility] 05/11/2013, 2:05 PM

## 2013-05-11 NOTE — Op Note (Signed)
NAMETERRICK, [name]                 ACCOUNT NO.:  [id number]  MEDICAL RECORD NO.:  [id number]  LOCATION:  5N08C                        FACILITY:  [facility]  PHYSICIAN:  Toni Arthurs, MD        DATE OF BIRTH:  1933-11-18  DATE OF PROCEDURE:  05/10/2013 DATE OF DISCHARGE:                              OPERATIVE REPORT   PREOPERATIVE DIAGNOSIS:  Right ankle arthritis.  POSTOPERATIVE DIAGNOSIS:  Right ankle arthritis.  PROCEDURE:  Right ankle arthrodesis.  SURGEON:  Toni Arthurs, MD  ANESTHESIA:  General, regional.  ESTIMATED BLOOD LOSS:  Minimal.  TOURNIQUET TIME:  111 minute at 250 mmHg.  COMPLICATIONS:  None apparent.  DISPOSITION:  Extubated, awake and stable to recovery.  INDICATIONS FOR PROCEDURE:  The patient is a 79 year old male with past medical history significant for right ankle arthritis.  He has failed nonoperative treatment including bracing, activity modifications, oral anti-inflammatories and shoe wear modifications.  He presents now for operative treatment of this condition.  He understands the risks, benefits, the alternative treatment options and elects operative treatment.  He specifically understands risks of bleeding, infection, nerve damage, blood clots, need for additional surgery, nonunion, amputation, and death.  PROCEDURE IN DETAIL:  After preoperative consent was obtained and the correct operative site was identified, the patient was brought to the operating room and placed supine on the operating table.  General anesthesia was induced.  Preoperative antibiotics were administered. Surgical time-out was taken.  The right lower extremity was prepped and draped in standard sterile fashion with tourniquet around the thigh. The extremity was exsanguinated, then tourniquet was inflated to 250 mmHg.  A longitudinal incision was made over the lateral malleolus. Sharp dissection was carried down through the skin and subcutaneous tissue.  The distal fibula was  exposed anteriorly.  An oscillating saw was used to transect the fibula several centimeters proximal from the ankle joint.  The fibula was then released anteriorly and reflected posteriorly, maintained its soft tissue attachments on the posterior edge of the fibula.  The oscillating saw was then used to make a cut in the sagittal plane, removing the inner half of the fibula.  The inner half was then morselized as bone graft.  The ankle joint was then exposed.  All remaining articular cartilage was removed with curettes, rongeurs and osteotomes.  Wound was irrigated copiously.  A 3.5-mm drill bit was then used to perforate the remaining subchondral surfaces in multiple locations.  A 0.25-inch osteotome was then used to break up the remaining subchondral bone between the drill holes.  The joint was then reduced.  A guidepin for the [employer] 6.5-mm cannulated screw set was then advanced from the anteromedial tibial metaphysis across the ankle joint into the talar dome.  Second screw was inserted from the anterolateral distal tibia into the medial talus and then a third pin was inserted from posterolateral to the talar head.  AP and lateral fluoroscopic images confirmed appropriate reduction of the joint and appropriate position and length of all guidepins.  The guidepins were all then drilled and 6.5-mm partially threaded cannulated screws were inserted. These were noted to have excellent purchase and compressed the joint appropriately.  Prior to inserting the screws, bone graft and demineralized bone matrix had been packed into the joint.  At this point, the remaining bone graft and DBM were packed into the joint.  The fibula was then placed back on the lateral aspect of the tibia and clamped into position with a Weber tenaculum.  The fragment of fibula was secured proximally and distally with 3.5-mm fully-threaded screws with washers compressing the fibula fragment to the lateral distal  tibia and the lateral proximal talus.  The wound was again irrigated copiously.  The remaining DBM was packed at the interval between the fibular fragments.  Inverted simple sutures of 0 Vicryl were used to close the deep subcutaneous tissues over the lateral incision.  The superficial subcutaneous tissues were closed with inverted simple sutures of 3-0 Vicryl.  The skin was closed with a running 3-0 Prolene. The stab incisions were all closed with horizontal mattress sutures of 3- 0 Prolene.  Sterile dressings were applied followed by well-padded short- leg splint.  Tourniquet was released at 111 minutes after application of the dressings.  The patient was awakened by Anesthesia and transported to the recovery room in stable condition.  FOLLOWUP PLAN:  The patient will be nonweightbearing on the right lower extremity.  He will be observed overnight for pain control and physical therapy.  He will be started on aspirin for DVT prophylaxis along with his SCDs.     Toni Arthurs, MD     JH/MEDQ  D:  05/10/2013  T:  05/11/2013  Job:  [id number]

## 2013-05-11 NOTE — Progress Notes (Signed)
05/11/13 1500  PT Visit Information  Last PT Received On 05/11/13  Assistance Needed +1 (+2 would have been helpful )  PT Time Calculation  PT Start Time 1450  PT Stop Time 1522  PT Time Calculation (min) 32 min  Subjective Data  Subjective I might need to stay  Precautions  Precautions Fall  Precaution Comments pt with near fall during pm session today due to vertigo  Restrictions  RLE Weight Bearing NWB  Cognition  Arousal/Alertness Awake/alert  Behavior During Therapy WFL for tasks assessed/performed  Overall Cognitive Status Within Functional Limits for tasks assessed  Bed Mobility  Bed Mobility Sit to Supine  Sit to Supine 4: Min guard  Details for Bed Mobility Assistance min/guard with RLE  Transfers  Transfers Sit to Stand;Stand to Sit;Stand Pivot Transfers  Sit to Stand 4: Min assist;Other (comment);From chair/3-in-1 (wheelchair)  Stand to Sit 2: Max assist;4: Min assist;To bed;Other (comment) (wheelchair)  Stand Pivot Transfers 4: Min assist  Details for Transfer Assistance verbal cues for hand placement and NWB on RLE; pt requring increased assist for stand to sit after amb to gym on knee walker due to episode of vertigo (pt has vertigo at baseline x 37yrs) per his report  Ambulation/Gait  Ambulation/Gait Assistance 4: Min assist  Ambulation Distance (Feet) 130 Feet  Assistive device Other (Comment) (knee walker)  Stairs (unable to attempt due to vertigo)  PT - End of Session  Equipment Utilized During Treatment Gait belt  Activity Tolerance Other (comment) (vertigo)  Patient left in bed;with call bell/phone within reach  Nurse Communication Mobility status  PT - Assessment/Plan  Comments on Treatment Session pt with near fall this pm having to be pulled back into wheelchair by PT to prevent fall related to vertigo and LLE weakness; Pt  requiring more cues for RW safety this pm as well; states he does not feel well/is unsteady; Recommend pt stay tonight as he  is unsafe to D/C home at this time; RN notified; Pt in agreement with plan; Will need to practice 2 stairs prior to D/C home; he may want to practice with knee walker again also  PT Plan Discharge plan remains appropriate;Frequency remains appropriate  PT Frequency Min 6X/week  Follow Up Recommendations Home health PT;Supervision for mobility/OOB  PT equipment Rolling walker with 5" wheels;Other (comment) (also has script for knee walker)  Acute Rehab PT Goals  Time For Goal Achievement 05/12/13  Potential to Achieve Goals Good  Pt will go Sit to Stand with supervision  PT Goal: Sit to Stand - Progress Progressing toward goal  Pt will go Stand to Sit with supervision  PT Goal: Stand to Sit - Progress Progressing toward goal  Pt will Ambulate 16 - 50 feet;with min assist;with rolling walker;Other (comment) (or knee walker)  PT Goal: Ambulate - Progress Progressing toward goal  Pt will Go Up / Down Stairs 1-2 stairs;with min assist;with rail(s);with least restrictive assistive device  PT General Charges  $$ ACUTE PT VISIT 1 Procedure  PT Treatments  $Gait Training 8-22 mins  $Therapeutic Activity 8-22 mins

## 2013-05-11 NOTE — Discharge Summary (Signed)
Physician Discharge Summary  Patient ID: [name] MRN: [id number] DOB/AGE: July 16, 1933 79 y.o.  Admit date: 05/10/2013 Discharge date: 05/11/2013  Admission Diagnoses:  Left ankle arthritis, htn  Discharge Diagnoses:  Same s/p L ankle arthrodesis  Discharged Condition: stable  Hospital Course: Pt was admitted and taken to the OR yesterday for L ankle arthrodesis.  He tolerated the procedure well and was transferred to 5N.  He did well with PT and is discharged to home in stable condition.  Consults: None  Significant Diagnostic Studies: none  Treatments: therapies: PT  Discharge Exam: Blood pressure 127/78, pulse 76, temperature 98 F (36.7 C), temperature source Oral, resp. rate 16, SpO2 94.00%. wn wd male in nad.  R ankle splinted.  Wiggles toes.  Feels LT at toes.  Brisk cap refill at toes.  Disposition: to home in stable condition.  Discharge Orders   Future Orders Complete By Expires     Call MD / Call 911  As directed     Comments:      If you experience chest pain or shortness of breath, CALL 911 and be transported to the hospital emergency room.  If you develope a fever above 101 F, pus (white drainage) or increased drainage or redness at the wound, or calf pain, call your surgeon's office.    Constipation Prevention  As directed     Comments:      Drink plenty of fluids.  Prune juice may be helpful.  You may use a stool softener, such as Colace (over the counter) 100 mg twice a day.  Use MiraLax (over the counter) for constipation as needed.    Diet - low sodium heart healthy  As directed     Increase activity slowly as tolerated  As directed         Medication List    TAKE these medications       amLODipine 5 MG tablet  Commonly known as:  NORVASC  Take 5 mg by mouth daily.     aspirin EC 81 MG tablet  Take 81 mg by mouth daily.     losartan 100 MG tablet  Commonly known as:  COZAAR  Take 100 mg by mouth at bedtime.     metoprolol 100 MG tablet   Commonly known as:  LOPRESSOR  Take 100 mg by mouth 2 (two) times daily.     multivitamin with minerals Tabs  Take 1 tablet by mouth daily.     oxyCODONE 5 MG immediate release tablet  Commonly known as:  Oxy IR/ROXICODONE  Take 1-2 tablets (5-10 mg total) by mouth every 4 (four) hours as needed for pain.           Follow-up Information   Follow up with Danel Requena, Jonny Ruiz, MD. Schedule an appointment as soon as possible for a visit in 2 weeks.   Contact information:   [address] [place] [address] [phone]       Signed: Toni Arthurs 05/11/2013, 1:13 PM

## 2013-05-11 NOTE — Progress Notes (Signed)
Updated Dr. Victorino Dike about Mr. [name] ambulation with physical therapy.  Dr. Victorino Dike gave order to cancel d/c for today and to reevaluate tomorrow.  Nsg to continue to monitor.

## 2013-05-11 NOTE — Progress Notes (Signed)
UR COMPLETED  

## 2013-05-12 NOTE — Progress Notes (Signed)
1150 Pt. Prepared for discharge. VSS; All discharge instructions provided; copy placed in patient's chart; prescriptions provided in hand.  No further questions asked.  All personal belongings in tow.  1240 Transport provided via wheelchair by volunteer.  Family to escort patient home.

## 2013-05-12 NOTE — Progress Notes (Signed)
Subjective: 2 Days Post-Op Procedure(s) (LRB): ARTHRODESIS ANKLE (Right) PERCUTANEOUS ACHILLES LENGTHENING (Right) Patient reports pain as mild.  Improved since yesterday. Feels ready for D/C today. Seen by myself and Dr. Shelle Iron in AM rounds.  Objective: Vital signs in last 24 hours: Temp:  [98.9 F (37.2 C)-99.3 F (37.4 C)] 99.3 F (37.4 C) (06/21 0448) Pulse Rate:  [78-93] 78 (06/21 0448) Resp:  [16] 16 (06/21 0448) BP: (141-147)/(75-81) 147/81 mmHg (06/21 0448) SpO2:  [91 %-93 %] 91 % (06/21 0448)  Intake/Output from previous day: 06/20 0701 - 06/21 0700 In: -  Out: 350 [Urine:350] Intake/Output this shift: Total I/O In: -  Out: 200 [Urine:200]   Recent Labs  05/10/13 2034  HGB 13.5    Recent Labs  05/10/13 2034  WBC 8.2  RBC 4.50  HCT 40.6  PLT 226    Recent Labs  05/10/13 2034  CREATININE 0.87   No results found for this basename: LABPT, INR,  in the last 72 hours  Neurologically intact ABD soft Neurovascular intact Sensation intact distally Intact pulses distally Dorsiflexion/Plantar flexion intact Incision: dressing C/D/I and no drainage No cellulitis present Compartment soft  Assessment/Plan: 2 Days Post-Op Procedure(s) (LRB): ARTHRODESIS ANKLE (Right) PERCUTANEOUS ACHILLES LENGTHENING (Right) Advance diet Up with therapy Remain NWB RLE Keep dressing clean and dry, ice and elevate to reduce swelling Plan for D/C today after PT as long well tolerated and remains safe while NWB RLE  BISSELL, JACLYN M. 05/12/2013, 9:16 AM

## 2013-05-12 NOTE — Progress Notes (Signed)
Physical Therapy Treatment Patient Details Name: [name] MRN: [id number] DOB: July 29, 1933 Today's Date: 05/12/2013 Time: 0922-0955 PT Time Calculation (min): 33 min  PT Assessment / Plan / Recommendation Comments on Treatment Session  POD # 2 R ankle arthrodesis and achilles lengthening currently in cast @ NWB.  Pt reports he feels much better today and eager to D/C to home.  Amb short distance then practiced 2 stair steps using one rail and one crutch. Pt instructed on safety with stairs/gait/transfers and importance of keeping R LE elevated.     Follow Up Recommendations  Home health PT;Supervision for mobility/OOB     Does the patient have the potential to tolerate intense rehabilitation     Barriers to Discharge        Equipment Recommendations  Rolling walker with 5" wheels;Other (comment) (knee scooter)    Recommendations for Other Services    Frequency Min 6X/week   Plan Discharge plan remains appropriate;Frequency remains appropriate    Precautions / Restrictions Precautions Precautions: Fall Restrictions Weight Bearing Restrictions: Yes RLE Weight Bearing: Non weight bearing   Pertinent Vitals/Pain C/o 5/10 pain during gait with LE in depedent position    Mobility  Bed Mobility Bed Mobility: Not assessed Details for Bed Mobility Assistance: Pt OOB in recliner Transfers Transfers: Stand to Sit Sit to Stand: 4: Min assist;4: Min guard;From chair/3-in-1 Stand to Sit: 4: Min assist;4: Min guard;To chair/3-in-1 Details for Transfer Assistance: 50% VC's on proper tech and hand placement plus 75% VC's on safety with turns esp completion prior to sit. Ambulation/Gait Ambulation/Gait Assistance: 4: Min assist;4: Min Government social research officer (Feet): 25 Feet Assistive device: Rolling walker Ambulation/Gait Assistance Details: 50% VC's on proper RW placement to self and sequencing.  Pt also instructed on safety with turns and negociating through tight places. Gait  velocity: NWB "hop" General Gait Details: fatigues quickly    PT Goals                                                     progressing    Visit Information  Last PT Received On: 05/12/13 Assistance Needed: +1    Subjective Data      Cognition       Balance   fair  End of Session PT - End of Session Equipment Utilized During Treatment: Gait belt Activity Tolerance: Patient tolerated treatment well Patient left: in chair;with call bell/phone within reach   Felecia Shelling  PTA [facility]  Acute  Rehab Pager      [phone]

## 2013-05-14 NOTE — Progress Notes (Signed)
NCM spoke to pt and offered choice for [facility]. States his brother had HH in the past and wanted to check with him on the agency. Will follow up on 05/15/2013. Isidoro Donning RN CCM Case Mgmt phone [phone]

## 2013-05-16 NOTE — Progress Notes (Signed)
   CARE MANAGEMENT NOTE 05/16/2013  Patient:  [name], [name]   Account Number:  [id number]  Date Initiated:  05/16/2013  Documentation initiated by:  [facility]  Subjective/Objective Assessment:   ARTHRODESIS ANKLE (Right)  PERCUTANEOUS ACHILLES LENGTHENING (Right)     Action/Plan:   Anticipated DC Date:  05/12/2013   Anticipated DC Plan:  HOME W HOME HEALTH SERVICES      DC Planning Services  CM consult      [facility] Choice  HOME HEALTH   Choice offered to / List presented to:  [name]        HH arranged  HH-2 PT      [facility] agency  [facility]   Status of service:  Completed, signed off Medicare Important Message given?   (If response is "NO", the following Medicare IM given date fields will be blank) Date Medicare IM given:   Date Additional Medicare IM given:    Discharge Disposition:  HOME W HOME HEALTH SERVICES  Per UR Regulation:    If discussed at Long Length of Stay Meetings, dates discussed:    Comments:  05/16/2013 1100 NCM spoke to pt and he requested [place] and [facility]. Contacted agency and they have no availability until next week. Explained to pt and he reports he is having difficulty with ambulating. NCM did contact [facility] and they can do follow up on 6/26. Notified pt of soc tomorrow. Isidoro Donning RN CCM Case Mgmt phone [phone]  05/14/2013 5:00 PM  NCM spoke to pt and offered choice for [facility]. States his brother had HH in the past and wanted to check with him on the agency. Will follow up on 05/15/2013. Isidoro Donning RN CCM Case Mgmt phone [phone]

## 2013-10-10 DIAGNOSIS — M4712 Other spondylosis with myelopathy, cervical region: Secondary | ICD-10-CM | POA: Insufficient documentation

## 2013-10-10 DIAGNOSIS — G959 Disease of spinal cord, unspecified: Secondary | ICD-10-CM | POA: Insufficient documentation

## 2013-10-10 DIAGNOSIS — M4802 Spinal stenosis, cervical region: Secondary | ICD-10-CM

## 2013-10-10 HISTORY — DX: Spinal stenosis, cervical region: M48.02

## 2013-10-10 HISTORY — DX: Disease of spinal cord, unspecified: G95.9

## 2013-10-10 HISTORY — DX: Other spondylosis with myelopathy, cervical region: M47.12

## 2014-11-13 DIAGNOSIS — H02403 Unspecified ptosis of bilateral eyelids: Secondary | ICD-10-CM

## 2014-11-13 HISTORY — DX: Unspecified ptosis of bilateral eyelids: H02.403

## 2015-02-10 DIAGNOSIS — Z87891 Personal history of nicotine dependence: Secondary | ICD-10-CM | POA: Diagnosis not present

## 2015-02-10 DIAGNOSIS — R0600 Dyspnea, unspecified: Secondary | ICD-10-CM | POA: Diagnosis not present

## 2015-02-10 DIAGNOSIS — R0602 Shortness of breath: Secondary | ICD-10-CM | POA: Diagnosis not present

## 2015-02-10 DIAGNOSIS — R079 Chest pain, unspecified: Secondary | ICD-10-CM | POA: Diagnosis not present

## 2015-02-10 DIAGNOSIS — I10 Essential (primary) hypertension: Secondary | ICD-10-CM | POA: Diagnosis not present

## 2015-02-10 DIAGNOSIS — R0789 Other chest pain: Secondary | ICD-10-CM | POA: Diagnosis not present

## 2015-02-10 DIAGNOSIS — Z7982 Long term (current) use of aspirin: Secondary | ICD-10-CM | POA: Diagnosis not present

## 2015-02-10 DIAGNOSIS — Z8673 Personal history of transient ischemic attack (TIA), and cerebral infarction without residual deficits: Secondary | ICD-10-CM | POA: Diagnosis not present

## 2015-02-11 DIAGNOSIS — R0600 Dyspnea, unspecified: Secondary | ICD-10-CM | POA: Diagnosis not present

## 2015-02-11 DIAGNOSIS — R079 Chest pain, unspecified: Secondary | ICD-10-CM | POA: Diagnosis not present

## 2015-02-12 DIAGNOSIS — G4733 Obstructive sleep apnea (adult) (pediatric): Secondary | ICD-10-CM | POA: Diagnosis not present

## 2015-02-12 DIAGNOSIS — R1313 Dysphagia, pharyngeal phase: Secondary | ICD-10-CM | POA: Diagnosis not present

## 2015-02-12 DIAGNOSIS — M1712 Unilateral primary osteoarthritis, left knee: Secondary | ICD-10-CM | POA: Diagnosis not present

## 2015-02-12 DIAGNOSIS — Z6828 Body mass index (BMI) 28.0-28.9, adult: Secondary | ICD-10-CM | POA: Diagnosis not present

## 2015-02-12 DIAGNOSIS — R079 Chest pain, unspecified: Secondary | ICD-10-CM | POA: Diagnosis not present

## 2015-02-19 DIAGNOSIS — I10 Essential (primary) hypertension: Secondary | ICD-10-CM | POA: Diagnosis not present

## 2015-02-19 DIAGNOSIS — I208 Other forms of angina pectoris: Secondary | ICD-10-CM | POA: Diagnosis not present

## 2015-02-24 DIAGNOSIS — M25562 Pain in left knee: Secondary | ICD-10-CM

## 2015-02-24 DIAGNOSIS — M25569 Pain in unspecified knee: Secondary | ICD-10-CM

## 2015-02-24 DIAGNOSIS — M1712 Unilateral primary osteoarthritis, left knee: Secondary | ICD-10-CM | POA: Diagnosis not present

## 2015-02-24 HISTORY — DX: Pain in unspecified knee: M25.569

## 2015-02-24 HISTORY — DX: Pain in left knee: M25.562

## 2015-02-27 DIAGNOSIS — M1712 Unilateral primary osteoarthritis, left knee: Secondary | ICD-10-CM | POA: Diagnosis not present

## 2015-02-27 DIAGNOSIS — M25562 Pain in left knee: Secondary | ICD-10-CM | POA: Diagnosis not present

## 2015-02-27 DIAGNOSIS — M23262 Derangement of other lateral meniscus due to old tear or injury, left knee: Secondary | ICD-10-CM | POA: Diagnosis not present

## 2015-02-27 DIAGNOSIS — M23201 Derangement of unspecified lateral meniscus due to old tear or injury, left knee: Secondary | ICD-10-CM | POA: Diagnosis not present

## 2015-02-28 DIAGNOSIS — G4730 Sleep apnea, unspecified: Secondary | ICD-10-CM | POA: Diagnosis not present

## 2015-03-05 DIAGNOSIS — R079 Chest pain, unspecified: Secondary | ICD-10-CM | POA: Diagnosis not present

## 2015-03-12 DIAGNOSIS — Z800 Family history of malignant neoplasm of digestive organs: Secondary | ICD-10-CM | POA: Diagnosis not present

## 2015-03-13 DIAGNOSIS — S83209A Unspecified tear of unspecified meniscus, current injury, unspecified knee, initial encounter: Secondary | ICD-10-CM | POA: Insufficient documentation

## 2015-03-13 DIAGNOSIS — S83207A Unspecified tear of unspecified meniscus, current injury, left knee, initial encounter: Secondary | ICD-10-CM

## 2015-03-13 DIAGNOSIS — S83282A Other tear of lateral meniscus, current injury, left knee, initial encounter: Secondary | ICD-10-CM | POA: Diagnosis not present

## 2015-03-13 DIAGNOSIS — M1712 Unilateral primary osteoarthritis, left knee: Secondary | ICD-10-CM | POA: Diagnosis not present

## 2015-03-13 HISTORY — DX: Unspecified tear of unspecified meniscus, current injury, left knee, initial encounter: S83.207A

## 2015-03-13 HISTORY — DX: Unspecified tear of unspecified meniscus, current injury, unspecified knee, initial encounter: S83.209A

## 2015-03-18 DIAGNOSIS — R079 Chest pain, unspecified: Secondary | ICD-10-CM | POA: Diagnosis not present

## 2015-03-19 DIAGNOSIS — R079 Chest pain, unspecified: Secondary | ICD-10-CM | POA: Diagnosis not present

## 2015-03-19 DIAGNOSIS — I10 Essential (primary) hypertension: Secondary | ICD-10-CM | POA: Diagnosis not present

## 2015-03-21 DIAGNOSIS — G4733 Obstructive sleep apnea (adult) (pediatric): Secondary | ICD-10-CM | POA: Diagnosis not present

## 2015-03-25 DIAGNOSIS — Z903 Acquired absence of stomach [part of]: Secondary | ICD-10-CM | POA: Diagnosis not present

## 2015-03-25 DIAGNOSIS — R1310 Dysphagia, unspecified: Secondary | ICD-10-CM | POA: Diagnosis not present

## 2015-03-25 DIAGNOSIS — K219 Gastro-esophageal reflux disease without esophagitis: Secondary | ICD-10-CM | POA: Diagnosis not present

## 2015-03-26 DIAGNOSIS — M6752 Plica syndrome, left knee: Secondary | ICD-10-CM | POA: Diagnosis not present

## 2015-03-26 DIAGNOSIS — M2242 Chondromalacia patellae, left knee: Secondary | ICD-10-CM | POA: Diagnosis not present

## 2015-03-26 DIAGNOSIS — S83272A Complex tear of lateral meniscus, current injury, left knee, initial encounter: Secondary | ICD-10-CM | POA: Diagnosis not present

## 2015-03-26 DIAGNOSIS — X58XXXA Exposure to other specified factors, initial encounter: Secondary | ICD-10-CM | POA: Diagnosis not present

## 2015-03-26 DIAGNOSIS — Y929 Unspecified place or not applicable: Secondary | ICD-10-CM | POA: Diagnosis not present

## 2015-03-31 DIAGNOSIS — Z9889 Other specified postprocedural states: Secondary | ICD-10-CM | POA: Insufficient documentation

## 2015-03-31 HISTORY — DX: Other specified postprocedural states: Z98.890

## 2015-04-01 DIAGNOSIS — M25562 Pain in left knee: Secondary | ICD-10-CM | POA: Diagnosis not present

## 2015-04-03 DIAGNOSIS — M25562 Pain in left knee: Secondary | ICD-10-CM | POA: Diagnosis not present

## 2015-04-07 DIAGNOSIS — M25562 Pain in left knee: Secondary | ICD-10-CM | POA: Diagnosis not present

## 2015-04-09 DIAGNOSIS — M25562 Pain in left knee: Secondary | ICD-10-CM | POA: Diagnosis not present

## 2015-04-15 DIAGNOSIS — M25562 Pain in left knee: Secondary | ICD-10-CM | POA: Diagnosis not present

## 2015-04-17 DIAGNOSIS — M25562 Pain in left knee: Secondary | ICD-10-CM | POA: Diagnosis not present

## 2015-04-18 DIAGNOSIS — M4712 Other spondylosis with myelopathy, cervical region: Secondary | ICD-10-CM | POA: Diagnosis not present

## 2015-04-18 DIAGNOSIS — I10 Essential (primary) hypertension: Secondary | ICD-10-CM | POA: Diagnosis not present

## 2015-04-18 DIAGNOSIS — Z139 Encounter for screening, unspecified: Secondary | ICD-10-CM | POA: Diagnosis not present

## 2015-04-18 DIAGNOSIS — R42 Dizziness and giddiness: Secondary | ICD-10-CM | POA: Diagnosis not present

## 2015-04-18 DIAGNOSIS — H6122 Impacted cerumen, left ear: Secondary | ICD-10-CM | POA: Diagnosis not present

## 2015-04-18 DIAGNOSIS — Z9181 History of falling: Secondary | ICD-10-CM | POA: Diagnosis not present

## 2015-04-18 DIAGNOSIS — Z1389 Encounter for screening for other disorder: Secondary | ICD-10-CM | POA: Diagnosis not present

## 2015-04-18 DIAGNOSIS — M47812 Spondylosis without myelopathy or radiculopathy, cervical region: Secondary | ICD-10-CM | POA: Diagnosis not present

## 2015-04-20 DIAGNOSIS — G4733 Obstructive sleep apnea (adult) (pediatric): Secondary | ICD-10-CM | POA: Diagnosis not present

## 2015-04-21 DIAGNOSIS — R110 Nausea: Secondary | ICD-10-CM | POA: Diagnosis not present

## 2015-04-21 DIAGNOSIS — R1030 Lower abdominal pain, unspecified: Secondary | ICD-10-CM | POA: Diagnosis not present

## 2015-04-21 DIAGNOSIS — K868 Other specified diseases of pancreas: Secondary | ICD-10-CM | POA: Diagnosis not present

## 2015-04-21 DIAGNOSIS — R197 Diarrhea, unspecified: Secondary | ICD-10-CM | POA: Diagnosis not present

## 2015-04-21 DIAGNOSIS — R109 Unspecified abdominal pain: Secondary | ICD-10-CM | POA: Diagnosis not present

## 2015-04-21 DIAGNOSIS — N281 Cyst of kidney, acquired: Secondary | ICD-10-CM | POA: Diagnosis not present

## 2015-04-21 DIAGNOSIS — N200 Calculus of kidney: Secondary | ICD-10-CM | POA: Diagnosis not present

## 2015-04-22 DIAGNOSIS — Z125 Encounter for screening for malignant neoplasm of prostate: Secondary | ICD-10-CM | POA: Diagnosis not present

## 2015-04-22 DIAGNOSIS — E119 Type 2 diabetes mellitus without complications: Secondary | ICD-10-CM | POA: Diagnosis not present

## 2015-04-30 DIAGNOSIS — G4733 Obstructive sleep apnea (adult) (pediatric): Secondary | ICD-10-CM | POA: Diagnosis not present

## 2015-04-30 DIAGNOSIS — Z6827 Body mass index (BMI) 27.0-27.9, adult: Secondary | ICD-10-CM | POA: Diagnosis not present

## 2015-04-30 DIAGNOSIS — R42 Dizziness and giddiness: Secondary | ICD-10-CM | POA: Diagnosis not present

## 2015-05-01 DIAGNOSIS — N401 Enlarged prostate with lower urinary tract symptoms: Secondary | ICD-10-CM | POA: Diagnosis not present

## 2015-05-01 DIAGNOSIS — E291 Testicular hypofunction: Secondary | ICD-10-CM | POA: Diagnosis not present

## 2015-05-01 DIAGNOSIS — Z8042 Family history of malignant neoplasm of prostate: Secondary | ICD-10-CM | POA: Diagnosis not present

## 2015-05-02 DIAGNOSIS — M4802 Spinal stenosis, cervical region: Secondary | ICD-10-CM | POA: Diagnosis not present

## 2015-05-02 DIAGNOSIS — M9971 Connective tissue and disc stenosis of intervertebral foramina of cervical region: Secondary | ICD-10-CM | POA: Diagnosis not present

## 2015-05-02 DIAGNOSIS — M4712 Other spondylosis with myelopathy, cervical region: Secondary | ICD-10-CM | POA: Diagnosis not present

## 2015-05-14 DIAGNOSIS — M542 Cervicalgia: Secondary | ICD-10-CM | POA: Insufficient documentation

## 2015-05-14 DIAGNOSIS — I10 Essential (primary) hypertension: Secondary | ICD-10-CM | POA: Diagnosis not present

## 2015-06-16 DIAGNOSIS — H53002 Unspecified amblyopia, left eye: Secondary | ICD-10-CM | POA: Diagnosis not present

## 2015-06-16 DIAGNOSIS — H524 Presbyopia: Secondary | ICD-10-CM | POA: Diagnosis not present

## 2015-06-16 DIAGNOSIS — H2513 Age-related nuclear cataract, bilateral: Secondary | ICD-10-CM | POA: Diagnosis not present

## 2015-06-16 DIAGNOSIS — H02403 Unspecified ptosis of bilateral eyelids: Secondary | ICD-10-CM | POA: Diagnosis not present

## 2015-07-11 DIAGNOSIS — Z6827 Body mass index (BMI) 27.0-27.9, adult: Secondary | ICD-10-CM | POA: Diagnosis not present

## 2015-07-11 DIAGNOSIS — L309 Dermatitis, unspecified: Secondary | ICD-10-CM | POA: Diagnosis not present

## 2015-07-21 DIAGNOSIS — L989 Disorder of the skin and subcutaneous tissue, unspecified: Secondary | ICD-10-CM | POA: Diagnosis not present

## 2015-07-21 DIAGNOSIS — Z6827 Body mass index (BMI) 27.0-27.9, adult: Secondary | ICD-10-CM | POA: Diagnosis not present

## 2015-07-21 DIAGNOSIS — M25512 Pain in left shoulder: Secondary | ICD-10-CM | POA: Diagnosis not present

## 2015-07-21 DIAGNOSIS — L309 Dermatitis, unspecified: Secondary | ICD-10-CM | POA: Diagnosis not present

## 2015-08-07 DIAGNOSIS — M25512 Pain in left shoulder: Secondary | ICD-10-CM | POA: Diagnosis not present

## 2015-08-28 DIAGNOSIS — L300 Nummular dermatitis: Secondary | ICD-10-CM | POA: Diagnosis not present

## 2015-08-28 DIAGNOSIS — L820 Inflamed seborrheic keratosis: Secondary | ICD-10-CM | POA: Diagnosis not present

## 2015-09-08 DIAGNOSIS — M25571 Pain in right ankle and joints of right foot: Secondary | ICD-10-CM | POA: Diagnosis not present

## 2015-09-08 DIAGNOSIS — Z23 Encounter for immunization: Secondary | ICD-10-CM | POA: Diagnosis not present

## 2015-09-08 DIAGNOSIS — Z6827 Body mass index (BMI) 27.0-27.9, adult: Secondary | ICD-10-CM | POA: Diagnosis not present

## 2015-09-08 DIAGNOSIS — R42 Dizziness and giddiness: Secondary | ICD-10-CM | POA: Diagnosis not present

## 2015-09-18 DIAGNOSIS — M25512 Pain in left shoulder: Secondary | ICD-10-CM | POA: Diagnosis not present

## 2015-10-21 DIAGNOSIS — Z6828 Body mass index (BMI) 28.0-28.9, adult: Secondary | ICD-10-CM | POA: Diagnosis not present

## 2015-10-21 DIAGNOSIS — E119 Type 2 diabetes mellitus without complications: Secondary | ICD-10-CM | POA: Diagnosis not present

## 2015-10-21 DIAGNOSIS — Z23 Encounter for immunization: Secondary | ICD-10-CM | POA: Diagnosis not present

## 2015-10-21 DIAGNOSIS — Z1389 Encounter for screening for other disorder: Secondary | ICD-10-CM | POA: Diagnosis not present

## 2015-10-21 DIAGNOSIS — I10 Essential (primary) hypertension: Secondary | ICD-10-CM | POA: Diagnosis not present

## 2015-10-22 DIAGNOSIS — E119 Type 2 diabetes mellitus without complications: Secondary | ICD-10-CM | POA: Diagnosis not present

## 2015-11-20 DIAGNOSIS — J208 Acute bronchitis due to other specified organisms: Secondary | ICD-10-CM | POA: Diagnosis not present

## 2015-11-20 DIAGNOSIS — H6123 Impacted cerumen, bilateral: Secondary | ICD-10-CM | POA: Diagnosis not present

## 2015-11-20 DIAGNOSIS — Z6827 Body mass index (BMI) 27.0-27.9, adult: Secondary | ICD-10-CM | POA: Diagnosis not present

## 2015-12-12 DIAGNOSIS — I10 Essential (primary) hypertension: Secondary | ICD-10-CM | POA: Diagnosis not present

## 2015-12-12 DIAGNOSIS — M542 Cervicalgia: Secondary | ICD-10-CM | POA: Diagnosis not present

## 2015-12-23 DIAGNOSIS — M5412 Radiculopathy, cervical region: Secondary | ICD-10-CM | POA: Insufficient documentation

## 2015-12-23 DIAGNOSIS — M47812 Spondylosis without myelopathy or radiculopathy, cervical region: Secondary | ICD-10-CM | POA: Diagnosis not present

## 2015-12-23 DIAGNOSIS — M4802 Spinal stenosis, cervical region: Secondary | ICD-10-CM | POA: Diagnosis not present

## 2015-12-23 DIAGNOSIS — I10 Essential (primary) hypertension: Secondary | ICD-10-CM | POA: Diagnosis not present

## 2015-12-24 DIAGNOSIS — M4802 Spinal stenosis, cervical region: Secondary | ICD-10-CM | POA: Diagnosis not present

## 2015-12-24 DIAGNOSIS — M5412 Radiculopathy, cervical region: Secondary | ICD-10-CM | POA: Diagnosis not present

## 2015-12-24 DIAGNOSIS — I10 Essential (primary) hypertension: Secondary | ICD-10-CM | POA: Diagnosis not present

## 2016-01-14 DIAGNOSIS — M5412 Radiculopathy, cervical region: Secondary | ICD-10-CM | POA: Diagnosis not present

## 2016-01-14 DIAGNOSIS — M4802 Spinal stenosis, cervical region: Secondary | ICD-10-CM | POA: Diagnosis not present

## 2016-01-14 DIAGNOSIS — M47812 Spondylosis without myelopathy or radiculopathy, cervical region: Secondary | ICD-10-CM | POA: Diagnosis not present

## 2016-01-14 DIAGNOSIS — I10 Essential (primary) hypertension: Secondary | ICD-10-CM | POA: Diagnosis not present

## 2016-01-28 DIAGNOSIS — L600 Ingrowing nail: Secondary | ICD-10-CM | POA: Insufficient documentation

## 2016-01-28 HISTORY — DX: Ingrowing nail: L60.0

## 2016-02-12 DIAGNOSIS — M542 Cervicalgia: Secondary | ICD-10-CM | POA: Diagnosis not present

## 2016-02-12 DIAGNOSIS — G8929 Other chronic pain: Secondary | ICD-10-CM | POA: Diagnosis not present

## 2016-02-12 DIAGNOSIS — M25562 Pain in left knee: Secondary | ICD-10-CM | POA: Diagnosis not present

## 2016-02-12 DIAGNOSIS — M549 Dorsalgia, unspecified: Secondary | ICD-10-CM | POA: Diagnosis not present

## 2016-02-19 DIAGNOSIS — Z95828 Presence of other vascular implants and grafts: Secondary | ICD-10-CM | POA: Diagnosis not present

## 2016-02-19 DIAGNOSIS — I82532 Chronic embolism and thrombosis of left popliteal vein: Secondary | ICD-10-CM | POA: Diagnosis not present

## 2016-02-19 DIAGNOSIS — M79662 Pain in left lower leg: Secondary | ICD-10-CM | POA: Diagnosis not present

## 2016-02-19 DIAGNOSIS — I82432 Acute embolism and thrombosis of left popliteal vein: Secondary | ICD-10-CM | POA: Diagnosis not present

## 2016-02-19 DIAGNOSIS — M7989 Other specified soft tissue disorders: Secondary | ICD-10-CM | POA: Diagnosis not present

## 2016-03-03 DIAGNOSIS — M5416 Radiculopathy, lumbar region: Secondary | ICD-10-CM | POA: Diagnosis not present

## 2016-03-03 DIAGNOSIS — I10 Essential (primary) hypertension: Secondary | ICD-10-CM | POA: Diagnosis not present

## 2016-03-08 DIAGNOSIS — M5416 Radiculopathy, lumbar region: Secondary | ICD-10-CM | POA: Diagnosis not present

## 2016-03-10 DIAGNOSIS — M5126 Other intervertebral disc displacement, lumbar region: Secondary | ICD-10-CM | POA: Diagnosis not present

## 2016-03-10 DIAGNOSIS — M5416 Radiculopathy, lumbar region: Secondary | ICD-10-CM | POA: Diagnosis not present

## 2016-03-17 ENCOUNTER — Other Ambulatory Visit: Payer: Self-pay | Admitting: Neurosurgery

## 2016-03-17 DIAGNOSIS — I10 Essential (primary) hypertension: Secondary | ICD-10-CM | POA: Diagnosis not present

## 2016-03-17 DIAGNOSIS — M5416 Radiculopathy, lumbar region: Secondary | ICD-10-CM

## 2016-03-17 DIAGNOSIS — Z6826 Body mass index (BMI) 26.0-26.9, adult: Secondary | ICD-10-CM | POA: Diagnosis not present

## 2016-03-19 ENCOUNTER — Ambulatory Visit
Admission: RE | Admit: 2016-03-19 | Discharge: 2016-03-19 | Disposition: A | Discharge status: Home / Self Care | Payer: Medicare Other | Source: Ambulatory Visit | Attending: Neurosurgery | Admitting: Neurosurgery

## 2016-03-19 DIAGNOSIS — M4806 Spinal stenosis, lumbar region: Secondary | ICD-10-CM | POA: Diagnosis not present

## 2016-03-19 DIAGNOSIS — M5416 Radiculopathy, lumbar region: Secondary | ICD-10-CM

## 2016-03-19 MED ORDER — METHYLPREDNISOLONE ACETATE 40 MG/ML INJ SUSP (RADIOLOG
120.0000 mg | Freq: Once | INTRAMUSCULAR | Status: AC
  Administered 2016-03-19: 120 mg via EPIDURAL

## 2016-03-19 MED ORDER — IOPAMIDOL (ISOVUE-M 200) INJECTION 41%
1.0000 mL | Freq: Once | INTRAMUSCULAR | Status: AC
  Administered 2016-03-19: 1 mL via EPIDURAL

## 2016-03-19 NOTE — Discharge Instructions (Signed)

## 2016-03-29 DIAGNOSIS — L600 Ingrowing nail: Secondary | ICD-10-CM | POA: Diagnosis not present

## 2016-04-12 DIAGNOSIS — L600 Ingrowing nail: Secondary | ICD-10-CM | POA: Diagnosis not present

## 2016-04-13 DIAGNOSIS — I10 Essential (primary) hypertension: Secondary | ICD-10-CM | POA: Diagnosis not present

## 2016-04-13 DIAGNOSIS — M5416 Radiculopathy, lumbar region: Secondary | ICD-10-CM | POA: Diagnosis not present

## 2016-04-22 DIAGNOSIS — E663 Overweight: Secondary | ICD-10-CM | POA: Diagnosis not present

## 2016-04-22 DIAGNOSIS — Z9181 History of falling: Secondary | ICD-10-CM | POA: Diagnosis not present

## 2016-04-22 DIAGNOSIS — E119 Type 2 diabetes mellitus without complications: Secondary | ICD-10-CM | POA: Diagnosis not present

## 2016-04-22 DIAGNOSIS — M7989 Other specified soft tissue disorders: Secondary | ICD-10-CM | POA: Diagnosis not present

## 2016-04-22 DIAGNOSIS — I82432 Acute embolism and thrombosis of left popliteal vein: Secondary | ICD-10-CM | POA: Diagnosis not present

## 2016-04-22 DIAGNOSIS — I10 Essential (primary) hypertension: Secondary | ICD-10-CM | POA: Diagnosis not present

## 2016-04-22 DIAGNOSIS — E785 Hyperlipidemia, unspecified: Secondary | ICD-10-CM | POA: Diagnosis not present

## 2016-04-22 DIAGNOSIS — Z139 Encounter for screening, unspecified: Secondary | ICD-10-CM | POA: Diagnosis not present

## 2016-04-29 DIAGNOSIS — M1712 Unilateral primary osteoarthritis, left knee: Secondary | ICD-10-CM | POA: Diagnosis not present

## 2016-04-29 DIAGNOSIS — M25472 Effusion, left ankle: Secondary | ICD-10-CM | POA: Diagnosis not present

## 2016-04-29 DIAGNOSIS — M19072 Primary osteoarthritis, left ankle and foot: Secondary | ICD-10-CM | POA: Diagnosis not present

## 2016-04-29 DIAGNOSIS — M25572 Pain in left ankle and joints of left foot: Secondary | ICD-10-CM | POA: Diagnosis not present

## 2016-05-03 DIAGNOSIS — S83282A Other tear of lateral meniscus, current injury, left knee, initial encounter: Secondary | ICD-10-CM | POA: Diagnosis not present

## 2016-05-03 DIAGNOSIS — S83242A Other tear of medial meniscus, current injury, left knee, initial encounter: Secondary | ICD-10-CM | POA: Diagnosis not present

## 2016-05-06 DIAGNOSIS — M19079 Primary osteoarthritis, unspecified ankle and foot: Secondary | ICD-10-CM | POA: Insufficient documentation

## 2016-05-06 DIAGNOSIS — S83282A Other tear of lateral meniscus, current injury, left knee, initial encounter: Secondary | ICD-10-CM | POA: Diagnosis not present

## 2016-05-06 DIAGNOSIS — M19071 Primary osteoarthritis, right ankle and foot: Secondary | ICD-10-CM | POA: Diagnosis not present

## 2016-05-06 DIAGNOSIS — M19072 Primary osteoarthritis, left ankle and foot: Secondary | ICD-10-CM | POA: Diagnosis not present

## 2016-05-06 HISTORY — DX: Primary osteoarthritis, unspecified ankle and foot: M19.079

## 2016-05-20 DIAGNOSIS — Z9889 Other specified postprocedural states: Secondary | ICD-10-CM | POA: Diagnosis not present

## 2016-05-20 DIAGNOSIS — I10 Essential (primary) hypertension: Secondary | ICD-10-CM | POA: Diagnosis not present

## 2016-05-20 DIAGNOSIS — Z7982 Long term (current) use of aspirin: Secondary | ICD-10-CM | POA: Diagnosis not present

## 2016-05-20 DIAGNOSIS — M19079 Primary osteoarthritis, unspecified ankle and foot: Secondary | ICD-10-CM | POA: Diagnosis not present

## 2016-05-20 DIAGNOSIS — Z8673 Personal history of transient ischemic attack (TIA), and cerebral infarction without residual deficits: Secondary | ICD-10-CM | POA: Diagnosis not present

## 2016-05-20 DIAGNOSIS — Z87891 Personal history of nicotine dependence: Secondary | ICD-10-CM | POA: Diagnosis not present

## 2016-05-20 DIAGNOSIS — Z981 Arthrodesis status: Secondary | ICD-10-CM | POA: Diagnosis not present

## 2016-05-20 DIAGNOSIS — Z79899 Other long term (current) drug therapy: Secondary | ICD-10-CM | POA: Diagnosis not present

## 2016-05-31 DIAGNOSIS — H53002 Unspecified amblyopia, left eye: Secondary | ICD-10-CM | POA: Diagnosis not present

## 2016-05-31 DIAGNOSIS — H02403 Unspecified ptosis of bilateral eyelids: Secondary | ICD-10-CM | POA: Diagnosis not present

## 2016-05-31 DIAGNOSIS — H2513 Age-related nuclear cataract, bilateral: Secondary | ICD-10-CM | POA: Diagnosis not present

## 2016-06-03 DIAGNOSIS — S83282D Other tear of lateral meniscus, current injury, left knee, subsequent encounter: Secondary | ICD-10-CM | POA: Diagnosis not present

## 2016-06-03 DIAGNOSIS — S83105A Unspecified dislocation of left knee, initial encounter: Secondary | ICD-10-CM

## 2016-06-03 DIAGNOSIS — S83282A Other tear of lateral meniscus, current injury, left knee, initial encounter: Secondary | ICD-10-CM

## 2016-06-03 DIAGNOSIS — M2342 Loose body in knee, left knee: Secondary | ICD-10-CM | POA: Diagnosis not present

## 2016-06-03 HISTORY — DX: Other tear of lateral meniscus, current injury, left knee, initial encounter: S83.105A

## 2016-06-09 DIAGNOSIS — G8918 Other acute postprocedural pain: Secondary | ICD-10-CM | POA: Diagnosis not present

## 2016-06-09 DIAGNOSIS — S83272A Complex tear of lateral meniscus, current injury, left knee, initial encounter: Secondary | ICD-10-CM | POA: Diagnosis not present

## 2016-06-09 DIAGNOSIS — M23262 Derangement of other lateral meniscus due to old tear or injury, left knee: Secondary | ICD-10-CM | POA: Diagnosis not present

## 2016-06-09 DIAGNOSIS — M23242 Derangement of anterior horn of lateral meniscus due to old tear or injury, left knee: Secondary | ICD-10-CM | POA: Diagnosis not present

## 2016-06-09 DIAGNOSIS — M94262 Chondromalacia, left knee: Secondary | ICD-10-CM | POA: Diagnosis not present

## 2016-06-15 DIAGNOSIS — M25562 Pain in left knee: Secondary | ICD-10-CM | POA: Diagnosis not present

## 2016-06-15 DIAGNOSIS — R2689 Other abnormalities of gait and mobility: Secondary | ICD-10-CM | POA: Diagnosis not present

## 2016-06-15 DIAGNOSIS — M6281 Muscle weakness (generalized): Secondary | ICD-10-CM | POA: Diagnosis not present

## 2016-06-23 DIAGNOSIS — M6281 Muscle weakness (generalized): Secondary | ICD-10-CM | POA: Diagnosis not present

## 2016-06-23 DIAGNOSIS — R2689 Other abnormalities of gait and mobility: Secondary | ICD-10-CM | POA: Diagnosis not present

## 2016-06-23 DIAGNOSIS — M25562 Pain in left knee: Secondary | ICD-10-CM | POA: Diagnosis not present

## 2016-07-13 DIAGNOSIS — I82532 Chronic embolism and thrombosis of left popliteal vein: Secondary | ICD-10-CM | POA: Diagnosis not present

## 2016-07-19 DIAGNOSIS — H02403 Unspecified ptosis of bilateral eyelids: Secondary | ICD-10-CM | POA: Diagnosis not present

## 2016-08-26 DIAGNOSIS — M2141 Flat foot [pes planus] (acquired), right foot: Secondary | ICD-10-CM

## 2016-08-26 DIAGNOSIS — M19071 Primary osteoarthritis, right ankle and foot: Secondary | ICD-10-CM | POA: Diagnosis not present

## 2016-08-26 DIAGNOSIS — M2142 Flat foot [pes planus] (acquired), left foot: Secondary | ICD-10-CM

## 2016-08-26 DIAGNOSIS — M21072 Valgus deformity, not elsewhere classified, left ankle: Secondary | ICD-10-CM | POA: Diagnosis not present

## 2016-08-26 DIAGNOSIS — M7989 Other specified soft tissue disorders: Secondary | ICD-10-CM | POA: Diagnosis not present

## 2016-08-26 DIAGNOSIS — Z7982 Long term (current) use of aspirin: Secondary | ICD-10-CM | POA: Diagnosis not present

## 2016-08-26 DIAGNOSIS — I10 Essential (primary) hypertension: Secondary | ICD-10-CM | POA: Diagnosis not present

## 2016-08-26 DIAGNOSIS — Z86718 Personal history of other venous thrombosis and embolism: Secondary | ICD-10-CM | POA: Diagnosis not present

## 2016-08-26 DIAGNOSIS — Z87891 Personal history of nicotine dependence: Secondary | ICD-10-CM | POA: Diagnosis not present

## 2016-08-26 DIAGNOSIS — M7732 Calcaneal spur, left foot: Secondary | ICD-10-CM | POA: Diagnosis not present

## 2016-08-26 HISTORY — DX: Flat foot (pes planus) (acquired), left foot: M21.41

## 2016-09-20 DIAGNOSIS — H02402 Unspecified ptosis of left eyelid: Secondary | ICD-10-CM | POA: Diagnosis not present

## 2016-09-20 DIAGNOSIS — H02403 Unspecified ptosis of bilateral eyelids: Secondary | ICD-10-CM | POA: Diagnosis not present

## 2016-09-20 DIAGNOSIS — H02401 Unspecified ptosis of right eyelid: Secondary | ICD-10-CM | POA: Diagnosis not present

## 2016-10-27 DIAGNOSIS — E119 Type 2 diabetes mellitus without complications: Secondary | ICD-10-CM | POA: Diagnosis not present

## 2016-10-27 DIAGNOSIS — Z1389 Encounter for screening for other disorder: Secondary | ICD-10-CM | POA: Diagnosis not present

## 2016-10-27 DIAGNOSIS — I10 Essential (primary) hypertension: Secondary | ICD-10-CM | POA: Diagnosis not present

## 2016-10-27 DIAGNOSIS — E785 Hyperlipidemia, unspecified: Secondary | ICD-10-CM | POA: Diagnosis not present

## 2016-10-27 DIAGNOSIS — I82532 Chronic embolism and thrombosis of left popliteal vein: Secondary | ICD-10-CM | POA: Diagnosis not present

## 2016-10-27 DIAGNOSIS — Z23 Encounter for immunization: Secondary | ICD-10-CM | POA: Diagnosis not present

## 2016-11-02 DIAGNOSIS — I890 Lymphedema, not elsewhere classified: Secondary | ICD-10-CM | POA: Diagnosis not present

## 2016-11-02 DIAGNOSIS — G4730 Sleep apnea, unspecified: Secondary | ICD-10-CM | POA: Diagnosis not present

## 2016-11-02 DIAGNOSIS — Z8673 Personal history of transient ischemic attack (TIA), and cerebral infarction without residual deficits: Secondary | ICD-10-CM | POA: Diagnosis not present

## 2016-11-02 DIAGNOSIS — I10 Essential (primary) hypertension: Secondary | ICD-10-CM | POA: Diagnosis not present

## 2016-12-20 DIAGNOSIS — M2042 Other hammer toe(s) (acquired), left foot: Secondary | ICD-10-CM

## 2016-12-20 DIAGNOSIS — M7742 Metatarsalgia, left foot: Secondary | ICD-10-CM

## 2016-12-20 DIAGNOSIS — M216X2 Other acquired deformities of left foot: Secondary | ICD-10-CM | POA: Insufficient documentation

## 2016-12-20 HISTORY — DX: Metatarsalgia, left foot: M77.42

## 2016-12-20 HISTORY — DX: Other acquired deformities of left foot: M21.6X2

## 2016-12-20 HISTORY — DX: Other hammer toe(s) (acquired), left foot: M20.42

## 2016-12-23 DIAGNOSIS — K2900 Acute gastritis without bleeding: Secondary | ICD-10-CM | POA: Diagnosis not present

## 2016-12-28 DIAGNOSIS — H53002 Unspecified amblyopia, left eye: Secondary | ICD-10-CM | POA: Diagnosis not present

## 2016-12-28 DIAGNOSIS — H2513 Age-related nuclear cataract, bilateral: Secondary | ICD-10-CM | POA: Diagnosis not present

## 2016-12-28 DIAGNOSIS — H04123 Dry eye syndrome of bilateral lacrimal glands: Secondary | ICD-10-CM | POA: Diagnosis not present

## 2017-01-21 DIAGNOSIS — N401 Enlarged prostate with lower urinary tract symptoms: Secondary | ICD-10-CM | POA: Diagnosis not present

## 2017-01-21 DIAGNOSIS — Z125 Encounter for screening for malignant neoplasm of prostate: Secondary | ICD-10-CM | POA: Diagnosis not present

## 2017-03-07 DIAGNOSIS — N401 Enlarged prostate with lower urinary tract symptoms: Secondary | ICD-10-CM | POA: Diagnosis not present

## 2017-03-22 DIAGNOSIS — H6123 Impacted cerumen, bilateral: Secondary | ICD-10-CM | POA: Diagnosis not present

## 2017-04-01 DIAGNOSIS — M25551 Pain in right hip: Secondary | ICD-10-CM | POA: Diagnosis not present

## 2017-04-01 DIAGNOSIS — M5416 Radiculopathy, lumbar region: Secondary | ICD-10-CM | POA: Diagnosis not present

## 2017-04-01 DIAGNOSIS — M25559 Pain in unspecified hip: Secondary | ICD-10-CM | POA: Insufficient documentation

## 2017-04-01 DIAGNOSIS — M4316 Spondylolisthesis, lumbar region: Secondary | ICD-10-CM | POA: Diagnosis not present

## 2017-04-11 DIAGNOSIS — M5416 Radiculopathy, lumbar region: Secondary | ICD-10-CM | POA: Diagnosis not present

## 2017-04-11 DIAGNOSIS — M4316 Spondylolisthesis, lumbar region: Secondary | ICD-10-CM | POA: Diagnosis not present

## 2017-04-11 DIAGNOSIS — M5126 Other intervertebral disc displacement, lumbar region: Secondary | ICD-10-CM | POA: Diagnosis not present

## 2017-04-13 DIAGNOSIS — R404 Transient alteration of awareness: Secondary | ICD-10-CM | POA: Diagnosis not present

## 2017-04-13 DIAGNOSIS — R42 Dizziness and giddiness: Secondary | ICD-10-CM | POA: Diagnosis not present

## 2017-04-14 DIAGNOSIS — I10 Essential (primary) hypertension: Secondary | ICD-10-CM | POA: Diagnosis not present

## 2017-04-14 DIAGNOSIS — R42 Dizziness and giddiness: Secondary | ICD-10-CM | POA: Diagnosis not present

## 2017-04-14 DIAGNOSIS — Z86711 Personal history of pulmonary embolism: Secondary | ICD-10-CM | POA: Diagnosis not present

## 2017-04-14 DIAGNOSIS — E782 Mixed hyperlipidemia: Secondary | ICD-10-CM | POA: Diagnosis not present

## 2017-04-19 DIAGNOSIS — L821 Other seborrheic keratosis: Secondary | ICD-10-CM | POA: Diagnosis not present

## 2017-04-19 DIAGNOSIS — L570 Actinic keratosis: Secondary | ICD-10-CM | POA: Diagnosis not present

## 2017-04-19 DIAGNOSIS — L578 Other skin changes due to chronic exposure to nonionizing radiation: Secondary | ICD-10-CM | POA: Diagnosis not present

## 2017-04-25 DIAGNOSIS — M19072 Primary osteoarthritis, left ankle and foot: Secondary | ICD-10-CM | POA: Diagnosis not present

## 2017-05-03 DIAGNOSIS — E119 Type 2 diabetes mellitus without complications: Secondary | ICD-10-CM | POA: Diagnosis not present

## 2017-05-03 DIAGNOSIS — Z139 Encounter for screening, unspecified: Secondary | ICD-10-CM | POA: Diagnosis not present

## 2017-05-03 DIAGNOSIS — Z9181 History of falling: Secondary | ICD-10-CM | POA: Diagnosis not present

## 2017-05-03 DIAGNOSIS — I82532 Chronic embolism and thrombosis of left popliteal vein: Secondary | ICD-10-CM | POA: Diagnosis not present

## 2017-05-03 DIAGNOSIS — E785 Hyperlipidemia, unspecified: Secondary | ICD-10-CM | POA: Diagnosis not present

## 2017-05-03 DIAGNOSIS — I10 Essential (primary) hypertension: Secondary | ICD-10-CM | POA: Diagnosis not present

## 2017-05-26 DIAGNOSIS — M19072 Primary osteoarthritis, left ankle and foot: Secondary | ICD-10-CM | POA: Diagnosis not present

## 2017-06-27 DIAGNOSIS — H2513 Age-related nuclear cataract, bilateral: Secondary | ICD-10-CM | POA: Diagnosis not present

## 2017-06-28 DIAGNOSIS — H353131 Nonexudative age-related macular degeneration, bilateral, early dry stage: Secondary | ICD-10-CM | POA: Diagnosis not present

## 2017-06-28 DIAGNOSIS — H40033 Anatomical narrow angle, bilateral: Secondary | ICD-10-CM | POA: Diagnosis not present

## 2017-06-28 DIAGNOSIS — H25812 Combined forms of age-related cataract, left eye: Secondary | ICD-10-CM | POA: Diagnosis not present

## 2017-07-05 DIAGNOSIS — H2512 Age-related nuclear cataract, left eye: Secondary | ICD-10-CM | POA: Diagnosis not present

## 2017-07-05 DIAGNOSIS — H25812 Combined forms of age-related cataract, left eye: Secondary | ICD-10-CM | POA: Diagnosis not present

## 2017-07-12 DIAGNOSIS — H2511 Age-related nuclear cataract, right eye: Secondary | ICD-10-CM | POA: Diagnosis not present

## 2017-07-12 DIAGNOSIS — H25811 Combined forms of age-related cataract, right eye: Secondary | ICD-10-CM | POA: Diagnosis not present

## 2017-07-12 DIAGNOSIS — H40013 Open angle with borderline findings, low risk, bilateral: Secondary | ICD-10-CM | POA: Diagnosis not present

## 2017-08-10 DIAGNOSIS — H524 Presbyopia: Secondary | ICD-10-CM | POA: Diagnosis not present

## 2017-08-24 DIAGNOSIS — Z961 Presence of intraocular lens: Secondary | ICD-10-CM | POA: Diagnosis not present

## 2017-08-24 DIAGNOSIS — H02035 Senile entropion of left lower eyelid: Secondary | ICD-10-CM | POA: Diagnosis not present

## 2017-08-24 DIAGNOSIS — H02103 Unspecified ectropion of right eye, unspecified eyelid: Secondary | ICD-10-CM | POA: Diagnosis not present

## 2017-08-31 DIAGNOSIS — M79605 Pain in left leg: Secondary | ICD-10-CM | POA: Diagnosis not present

## 2017-08-31 DIAGNOSIS — M5416 Radiculopathy, lumbar region: Secondary | ICD-10-CM | POA: Diagnosis not present

## 2017-08-31 DIAGNOSIS — M545 Low back pain: Secondary | ICD-10-CM | POA: Diagnosis not present

## 2017-09-06 DIAGNOSIS — N401 Enlarged prostate with lower urinary tract symptoms: Secondary | ICD-10-CM | POA: Diagnosis not present

## 2017-09-08 DIAGNOSIS — M4316 Spondylolisthesis, lumbar region: Secondary | ICD-10-CM | POA: Diagnosis not present

## 2017-09-08 DIAGNOSIS — R1030 Lower abdominal pain, unspecified: Secondary | ICD-10-CM | POA: Insufficient documentation

## 2017-09-08 DIAGNOSIS — R1031 Right lower quadrant pain: Secondary | ICD-10-CM | POA: Diagnosis not present

## 2017-09-19 DIAGNOSIS — R1031 Right lower quadrant pain: Secondary | ICD-10-CM | POA: Diagnosis not present

## 2017-09-19 DIAGNOSIS — M25551 Pain in right hip: Secondary | ICD-10-CM | POA: Diagnosis not present

## 2017-09-23 DIAGNOSIS — H02132 Senile ectropion of right lower eyelid: Secondary | ICD-10-CM | POA: Diagnosis not present

## 2017-09-23 DIAGNOSIS — H02403 Unspecified ptosis of bilateral eyelids: Secondary | ICD-10-CM | POA: Diagnosis not present

## 2017-09-23 DIAGNOSIS — H02105 Unspecified ectropion of left lower eyelid: Secondary | ICD-10-CM | POA: Diagnosis not present

## 2017-09-23 DIAGNOSIS — H02135 Senile ectropion of left lower eyelid: Secondary | ICD-10-CM | POA: Diagnosis not present

## 2017-09-23 DIAGNOSIS — H02106 Unspecified ectropion of left eye, unspecified eyelid: Secondary | ICD-10-CM | POA: Diagnosis not present

## 2017-09-23 DIAGNOSIS — H02103 Unspecified ectropion of right eye, unspecified eyelid: Secondary | ICD-10-CM | POA: Diagnosis not present

## 2017-10-19 ENCOUNTER — Other Ambulatory Visit: Payer: Self-pay | Admitting: Neurosurgery

## 2017-10-19 DIAGNOSIS — M4316 Spondylolisthesis, lumbar region: Secondary | ICD-10-CM

## 2017-10-20 ENCOUNTER — Other Ambulatory Visit: Payer: Self-pay | Admitting: Neurosurgery

## 2017-10-20 DIAGNOSIS — M4316 Spondylolisthesis, lumbar region: Secondary | ICD-10-CM

## 2017-10-31 ENCOUNTER — Other Ambulatory Visit: Payer: Medicare Other

## 2017-11-04 ENCOUNTER — Ambulatory Visit
Admission: RE | Admit: 2017-11-04 | Discharge: 2017-11-04 | Disposition: A | Discharge status: Home / Self Care | Payer: Medicare Other | Source: Ambulatory Visit | Attending: Neurosurgery | Admitting: Neurosurgery

## 2017-11-04 ENCOUNTER — Other Ambulatory Visit: Payer: Medicare Other

## 2017-11-04 VITALS — BP 169/100 | HR 72

## 2017-11-04 DIAGNOSIS — M4802 Spinal stenosis, cervical region: Secondary | ICD-10-CM

## 2017-11-04 DIAGNOSIS — M48061 Spinal stenosis, lumbar region without neurogenic claudication: Secondary | ICD-10-CM | POA: Diagnosis not present

## 2017-11-04 DIAGNOSIS — M51379 Other intervertebral disc degeneration, lumbosacral region without mention of lumbar back pain or lower extremity pain: Secondary | ICD-10-CM

## 2017-11-04 DIAGNOSIS — M5137 Other intervertebral disc degeneration, lumbosacral region: Secondary | ICD-10-CM

## 2017-11-04 DIAGNOSIS — G959 Disease of spinal cord, unspecified: Secondary | ICD-10-CM

## 2017-11-04 DIAGNOSIS — M4316 Spondylolisthesis, lumbar region: Secondary | ICD-10-CM

## 2017-11-04 MED ORDER — DIAZEPAM 5 MG PO TABS
5.0000 mg | ORAL_TABLET | Freq: Once | ORAL | Status: AC
  Administered 2017-11-04: 5 mg via ORAL

## 2017-11-04 MED ORDER — IOPAMIDOL (ISOVUE-M 200) INJECTION 41%
15.0000 mL | Freq: Once | INTRAMUSCULAR | Status: AC
  Administered 2017-11-04: 15 mL via INTRATHECAL

## 2017-11-04 NOTE — Discharge Instructions (Signed)

## 2017-11-09 DIAGNOSIS — E785 Hyperlipidemia, unspecified: Secondary | ICD-10-CM | POA: Diagnosis not present

## 2017-11-09 DIAGNOSIS — Z139 Encounter for screening, unspecified: Secondary | ICD-10-CM | POA: Diagnosis not present

## 2017-11-09 DIAGNOSIS — I82532 Chronic embolism and thrombosis of left popliteal vein: Secondary | ICD-10-CM | POA: Diagnosis not present

## 2017-11-09 DIAGNOSIS — Z23 Encounter for immunization: Secondary | ICD-10-CM | POA: Diagnosis not present

## 2017-11-09 DIAGNOSIS — E119 Type 2 diabetes mellitus without complications: Secondary | ICD-10-CM | POA: Diagnosis not present

## 2017-11-09 DIAGNOSIS — Z1331 Encounter for screening for depression: Secondary | ICD-10-CM | POA: Diagnosis not present

## 2017-11-09 DIAGNOSIS — I10 Essential (primary) hypertension: Secondary | ICD-10-CM | POA: Diagnosis not present

## 2017-11-30 DIAGNOSIS — E785 Hyperlipidemia, unspecified: Secondary | ICD-10-CM | POA: Diagnosis not present

## 2017-11-30 DIAGNOSIS — Z0000 Encounter for general adult medical examination without abnormal findings: Secondary | ICD-10-CM | POA: Diagnosis not present

## 2017-11-30 DIAGNOSIS — Z135 Encounter for screening for eye and ear disorders: Secondary | ICD-10-CM | POA: Diagnosis not present

## 2017-11-30 DIAGNOSIS — Z9181 History of falling: Secondary | ICD-10-CM | POA: Diagnosis not present

## 2017-12-23 DIAGNOSIS — M5416 Radiculopathy, lumbar region: Secondary | ICD-10-CM | POA: Diagnosis not present

## 2017-12-23 DIAGNOSIS — M79605 Pain in left leg: Secondary | ICD-10-CM | POA: Diagnosis not present

## 2017-12-23 DIAGNOSIS — M545 Low back pain: Secondary | ICD-10-CM | POA: Diagnosis not present

## 2017-12-23 DIAGNOSIS — M533 Sacrococcygeal disorders, not elsewhere classified: Secondary | ICD-10-CM | POA: Diagnosis not present

## 2017-12-29 DIAGNOSIS — M533 Sacrococcygeal disorders, not elsewhere classified: Secondary | ICD-10-CM | POA: Diagnosis not present

## 2018-01-10 DIAGNOSIS — M7989 Other specified soft tissue disorders: Secondary | ICD-10-CM | POA: Diagnosis not present

## 2018-01-10 DIAGNOSIS — M79605 Pain in left leg: Secondary | ICD-10-CM | POA: Diagnosis not present

## 2018-01-10 DIAGNOSIS — M79662 Pain in left lower leg: Secondary | ICD-10-CM | POA: Diagnosis not present

## 2018-01-10 DIAGNOSIS — M533 Sacrococcygeal disorders, not elsewhere classified: Secondary | ICD-10-CM | POA: Diagnosis not present

## 2018-01-10 DIAGNOSIS — M5416 Radiculopathy, lumbar region: Secondary | ICD-10-CM | POA: Diagnosis not present

## 2018-01-26 DIAGNOSIS — M5416 Radiculopathy, lumbar region: Secondary | ICD-10-CM | POA: Diagnosis not present

## 2018-01-26 DIAGNOSIS — M48061 Spinal stenosis, lumbar region without neurogenic claudication: Secondary | ICD-10-CM | POA: Diagnosis not present

## 2018-01-26 DIAGNOSIS — M545 Low back pain: Secondary | ICD-10-CM | POA: Diagnosis not present

## 2018-01-27 DIAGNOSIS — M19071 Primary osteoarthritis, right ankle and foot: Secondary | ICD-10-CM | POA: Diagnosis not present

## 2018-01-27 DIAGNOSIS — M79672 Pain in left foot: Secondary | ICD-10-CM | POA: Diagnosis not present

## 2018-01-27 DIAGNOSIS — M19072 Primary osteoarthritis, left ankle and foot: Secondary | ICD-10-CM | POA: Diagnosis not present

## 2018-01-27 DIAGNOSIS — M25571 Pain in right ankle and joints of right foot: Secondary | ICD-10-CM | POA: Diagnosis not present

## 2018-02-08 ENCOUNTER — Other Ambulatory Visit: Payer: Self-pay | Admitting: Orthopedic Surgery

## 2018-02-09 DIAGNOSIS — R7303 Prediabetes: Secondary | ICD-10-CM | POA: Diagnosis not present

## 2018-02-09 DIAGNOSIS — H26493 Other secondary cataract, bilateral: Secondary | ICD-10-CM | POA: Diagnosis not present

## 2018-03-01 NOTE — Progress Notes (Addendum)
PCP: Dorothea Glassman, MD  Cardiologist: pt denies  EKG: pt denies past year, obtained  Stress test: 5+ year  ECHO: pt denies  Cardiac Cath: pt denies  Chest x-ray: pt denies past year, no recent respiratory infections/complications  Pt advised to stop aspirin 81 mg 5 days prior to surgery by his surgeon.

## 2018-03-01 NOTE — Pre-Procedure Instructions (Signed)
[facility]  03/01/2018      [id number] [address] [address], [place] [address] [place] [address] Phone: [phone] Fax: [phone]    Your procedure is scheduled on Tuesday, April 23rd.     Report to [facility] Admitting at 8:30am             (posted surgery time 10:30a - 12:51p)   Call this number if you have problems the morning of surgery:  [phone]   Remember:              4-5 days prior to surgery, STOP TAKING any Vitamins, Herbal Supplements, Anti-inflammatories.   Do not eat food or drink liquids after midnight, Monday.   Take these medicines the morning of surgery with A SIP OF WATER : Metoprolol   Do not wear jewelry - no rings or watches.  Do not wear lotions, colognes or deodorant.   Men may shave face and neck.  Do not bring valuables to the hospital.  [facility] is not responsible for any belongings or valuables.  Contacts, dentures or bridgework may not be worn into surgery.  Leave your suitcase in the car.  After surgery it may be brought to your room.  For patients admitted to the hospital, discharge time will be determined by your treatment team.  Please read over the following fact sheets that you were given. Pain Booklet, MRSA Information and Surgical Site Infection Prevention       [facility] [facility]- Preparing For Surgery  Before surgery, you can play an important role. Because skin is not sterile, your skin needs to be as free of germs as possible. You can reduce the number of germs on your skin by washing with CHG (chlorahexidine gluconate) Soap before surgery.  CHG is an antiseptic cleaner which kills germs and bonds with the skin to continue killing germs even after washing.  Please do not use if you have an allergy to CHG or antibacterial soaps. If your skin becomes reddened/irritated stop using the CHG.  Do not shave (including legs and underarms) for at  least 48 hours prior to first CHG shower. It is OK to shave your face.  Please follow these instructions carefully.   1. Shower the NIGHT BEFORE SURGERY and the MORNING OF SURGERY with CHG.   2. If you chose to wash your hair, wash your hair first as usual with your normal shampoo.  3. After you shampoo, rinse your hair and body thoroughly to remove the shampoo.  4. Use CHG as you would any other liquid soap. You can apply CHG directly to the skin and wash gently with a scrungie or a clean washcloth.   5. Apply the CHG Soap to your body ONLY FROM THE NECK DOWN.  Do not use on open wounds or open sores. Avoid contact with your eyes, ears, mouth and genitals (private parts). Wash Face and genitals (private parts)  with your normal soap.  6. Wash thoroughly, paying special attention to the area where your surgery will be performed.  7. Thoroughly rinse your body with warm water from the neck down.  8. DO NOT shower/wash with your normal soap after using and rinsing off the CHG Soap.  9. Pat yourself dry with a CLEAN TOWEL.  10. Wear CLEAN PAJAMAS to bed the night before surgery, wear comfortable clothes the morning of surgery  11.  Place CLEAN SHEETS on your bed the night of your first shower and DO NOT SLEEP WITH PETS.    Day of Surgery: Do not apply any deodorants/lotions. Please wear clean clothes to the hospital/surgery center.

## 2018-03-02 ENCOUNTER — Encounter (HOSPITAL_COMMUNITY)
Admission: RE | Admit: 2018-03-02 | Discharge: 2018-03-02 | Disposition: A | Discharge status: Home / Self Care | Payer: Medicare Other | Source: Ambulatory Visit | Attending: Orthopedic Surgery | Admitting: Orthopedic Surgery

## 2018-03-02 ENCOUNTER — Other Ambulatory Visit: Payer: Self-pay

## 2018-03-02 ENCOUNTER — Encounter (HOSPITAL_COMMUNITY): Payer: Self-pay

## 2018-03-02 DIAGNOSIS — Z01812 Encounter for preprocedural laboratory examination: Secondary | ICD-10-CM | POA: Diagnosis not present

## 2018-03-02 DIAGNOSIS — Z01810 Encounter for preprocedural cardiovascular examination: Secondary | ICD-10-CM | POA: Insufficient documentation

## 2018-03-02 HISTORY — DX: Personal history of urinary calculi: Z87.442

## 2018-03-02 LAB — BASIC METABOLIC PANEL
ANION GAP: 9 (ref 5–15)
BUN: 18 mg/dL (ref 6–20)
CO2: 25 mmol/L (ref 22–32)
CREATININE: 1.04 mg/dL (ref 0.61–1.24)
Calcium: 8.9 mg/dL (ref 8.9–10.3)
Chloride: 107 mmol/L (ref 101–111)
GLUCOSE: 107 mg/dL — AB (ref 65–99)
Potassium: 4 mmol/L (ref 3.5–5.1)
Sodium: 141 mmol/L (ref 135–145)

## 2018-03-02 LAB — CBC
HCT: 42.9 % (ref 39.0–52.0)
Hemoglobin: 13.6 g/dL (ref 13.0–17.0)
MCH: 29.7 pg (ref 26.0–34.0)
MCHC: 31.7 g/dL (ref 30.0–36.0)
MCV: 93.7 fL (ref 78.0–100.0)
PLATELETS: 223 10*3/uL (ref 150–400)
RBC: 4.58 MIL/uL (ref 4.22–5.81)
RDW: 13.8 % (ref 11.5–15.5)
WBC: 5.9 10*3/uL (ref 4.0–10.5)

## 2018-03-02 LAB — SURGICAL PCR SCREEN
MRSA, PCR: NEGATIVE
STAPHYLOCOCCUS AUREUS: NEGATIVE

## 2018-03-03 NOTE — Progress Notes (Addendum)
Anesthesia Chart Review:  Pt is an 84 year old male scheduled for L total ankle arthroplasty on 03/14/2018 with Wylene Simmer, MD  - PCP is Nelda Bucks, MD who signed letter of clearance for surgery.    PMH includes:  HTN, stroke (1979), OSA. Former smoker (quit 1980). BMI 27. S/p R ankle arthrosdesis 05/10/13  Medications include: ASA 81 mg, Lipitor, losartan, metoprolol  BP (!) 148/91   Pulse 63   Temp 36.7 C   Resp 20   Ht 5\' 10"  (1.778 m)   Wt 186 lb (84.4 kg)   SpO2 95%   BMI 26.69 kg/m   Preoperative labs reviewed.    EKG 03/02/18: NSR.  Inferior infarct, age undetermined. New q waves in III and aVF; prior EKG 05/08/13 showed small R waves in III and AVF (comparison EKG on paper chart)  Reviewed case with Dr. Lyndle Herrlich.   If no changes, I anticipate pt can proceed with surgery as scheduled.   Willeen Cass, FNP-BC [facility]/Anesthesiology Phone: [phone] 03/03/2018 3:21 PM

## 2018-03-13 MED ORDER — CEFAZOLIN SODIUM-DEXTROSE 2-4 GM/100ML-% IV SOLN
2.0000 g | INTRAVENOUS | Status: AC
  Administered 2018-03-14: 2 g via INTRAVENOUS
  Filled 2018-03-13: qty 100

## 2018-03-13 MED ORDER — SODIUM CHLORIDE 0.9 % IV SOLN: INTRAVENOUS | Status: DC

## 2018-03-14 ENCOUNTER — Encounter (HOSPITAL_COMMUNITY)
Admission: RE | Disposition: A | Discharge status: Skilled Nursing Facility | Payer: Self-pay | Source: Ambulatory Visit | Attending: Orthopedic Surgery

## 2018-03-14 ENCOUNTER — Inpatient Hospital Stay (HOSPITAL_COMMUNITY): Payer: Medicare Other | Admitting: Emergency Medicine

## 2018-03-14 ENCOUNTER — Inpatient Hospital Stay (HOSPITAL_COMMUNITY): Payer: Medicare Other | Admitting: Anesthesiology

## 2018-03-14 ENCOUNTER — Encounter (HOSPITAL_COMMUNITY): Payer: Self-pay | Admitting: Certified Registered"

## 2018-03-14 ENCOUNTER — Inpatient Hospital Stay (HOSPITAL_COMMUNITY)
Admission: RE | Admit: 2018-03-14 | Discharge: 2018-03-15 | DRG: 469 | Disposition: A | Discharge status: Skilled Nursing Facility | Payer: Medicare Other | Source: Ambulatory Visit | Attending: Orthopedic Surgery | Admitting: Orthopedic Surgery

## 2018-03-14 ENCOUNTER — Inpatient Hospital Stay (HOSPITAL_COMMUNITY): Payer: Medicare Other

## 2018-03-14 DIAGNOSIS — Z7982 Long term (current) use of aspirin: Secondary | ICD-10-CM | POA: Diagnosis not present

## 2018-03-14 DIAGNOSIS — M19072 Primary osteoarthritis, left ankle and foot: Secondary | ICD-10-CM | POA: Diagnosis not present

## 2018-03-14 DIAGNOSIS — Z8673 Personal history of transient ischemic attack (TIA), and cerebral infarction without residual deficits: Secondary | ICD-10-CM

## 2018-03-14 DIAGNOSIS — I10 Essential (primary) hypertension: Secondary | ICD-10-CM | POA: Diagnosis not present

## 2018-03-14 DIAGNOSIS — Z96662 Presence of left artificial ankle joint: Secondary | ICD-10-CM

## 2018-03-14 DIAGNOSIS — S8990XA Unspecified injury of unspecified lower leg, initial encounter: Secondary | ICD-10-CM | POA: Diagnosis not present

## 2018-03-14 DIAGNOSIS — G8918 Other acute postprocedural pain: Secondary | ICD-10-CM | POA: Diagnosis not present

## 2018-03-14 DIAGNOSIS — Z87891 Personal history of nicotine dependence: Secondary | ICD-10-CM

## 2018-03-14 DIAGNOSIS — S43492D Other sprain of left shoulder joint, subsequent encounter: Secondary | ICD-10-CM | POA: Diagnosis not present

## 2018-03-14 DIAGNOSIS — M1990 Unspecified osteoarthritis, unspecified site: Secondary | ICD-10-CM

## 2018-03-14 DIAGNOSIS — E785 Hyperlipidemia, unspecified: Secondary | ICD-10-CM | POA: Diagnosis not present

## 2018-03-14 DIAGNOSIS — S43492A Other sprain of left shoulder joint, initial encounter: Secondary | ICD-10-CM | POA: Diagnosis not present

## 2018-03-14 DIAGNOSIS — M25572 Pain in left ankle and joints of left foot: Secondary | ICD-10-CM | POA: Diagnosis present

## 2018-03-14 DIAGNOSIS — H02729 Madarosis of unspecified eye, unspecified eyelid and periocular area: Secondary | ICD-10-CM | POA: Diagnosis not present

## 2018-03-14 DIAGNOSIS — G8911 Acute pain due to trauma: Secondary | ICD-10-CM | POA: Diagnosis not present

## 2018-03-14 DIAGNOSIS — M4802 Spinal stenosis, cervical region: Secondary | ICD-10-CM | POA: Diagnosis not present

## 2018-03-14 DIAGNOSIS — M4800 Spinal stenosis, site unspecified: Secondary | ICD-10-CM | POA: Diagnosis not present

## 2018-03-14 DIAGNOSIS — Z471 Aftercare following joint replacement surgery: Secondary | ICD-10-CM | POA: Diagnosis not present

## 2018-03-14 DIAGNOSIS — M25562 Pain in left knee: Secondary | ICD-10-CM | POA: Diagnosis not present

## 2018-03-14 HISTORY — DX: Presence of left artificial ankle joint: Z96.662

## 2018-03-14 HISTORY — PX: TOTAL ANKLE ARTHROPLASTY: SHX811

## 2018-03-14 SURGERY — ARTHROPLASTY, ANKLE, TOTAL
Anesthesia: General | Laterality: Left

## 2018-03-14 MED ORDER — PROMETHAZINE HCL 25 MG/ML IJ SOLN: 6.2500 mg | INTRAMUSCULAR | Status: DC | PRN

## 2018-03-14 MED ORDER — ONDANSETRON HCL 4 MG/2ML IJ SOLN
INTRAMUSCULAR | Status: DC | PRN
  Administered 2018-03-14: 4 mg via INTRAVENOUS

## 2018-03-14 MED ORDER — DIPHENHYDRAMINE HCL 12.5 MG/5ML PO ELIX
12.5000 mg | ORAL_SOLUTION | ORAL | Status: DC | PRN
  Filled 2018-03-14: qty 10

## 2018-03-14 MED ORDER — SENNA 8.6 MG PO TABS
1.0000 | ORAL_TABLET | Freq: Two times a day (BID) | ORAL | Status: DC
  Filled 2018-03-14 (×2): qty 1

## 2018-03-14 MED ORDER — LOSARTAN POTASSIUM 50 MG PO TABS
100.0000 mg | ORAL_TABLET | Freq: Every day | ORAL | Status: DC
  Administered 2018-03-14: 100 mg via ORAL
  Filled 2018-03-14: qty 2

## 2018-03-14 MED ORDER — ACETAMINOPHEN 325 MG PO TABS: 325.0000 mg | ORAL_TABLET | Freq: Four times a day (QID) | ORAL | Status: DC | PRN

## 2018-03-14 MED ORDER — CHLORHEXIDINE GLUCONATE 4 % EX LIQD: 60.0000 mL | Freq: Once | CUTANEOUS | Status: DC

## 2018-03-14 MED ORDER — PROPOFOL 10 MG/ML IV BOLUS
INTRAVENOUS | Status: DC | PRN
  Administered 2018-03-14: 120 mg via INTRAVENOUS

## 2018-03-14 MED ORDER — LIDOCAINE 2% (20 MG/ML) 5 ML SYRINGE
INTRAMUSCULAR | Status: DC | PRN
  Administered 2018-03-14: 60 mg via INTRAVENOUS

## 2018-03-14 MED ORDER — SODIUM CHLORIDE 0.9 % IV SOLN
INTRAVENOUS | Status: DC
  Administered 2018-03-14: 18:00:00 via INTRAVENOUS
  Administered 2018-03-15: 75 mL/h via INTRAVENOUS

## 2018-03-14 MED ORDER — DEXAMETHASONE SODIUM PHOSPHATE 10 MG/ML IJ SOLN
INTRAMUSCULAR | Status: AC
  Filled 2018-03-14: qty 1

## 2018-03-14 MED ORDER — ONDANSETRON HCL 4 MG/2ML IJ SOLN: 4.0000 mg | Freq: Four times a day (QID) | INTRAMUSCULAR | Status: DC | PRN

## 2018-03-14 MED ORDER — MEPERIDINE HCL 50 MG/ML IJ SOLN: 6.2500 mg | INTRAMUSCULAR | Status: DC | PRN

## 2018-03-14 MED ORDER — LIDOCAINE 2% (20 MG/ML) 5 ML SYRINGE
INTRAMUSCULAR | Status: AC
  Filled 2018-03-14: qty 5

## 2018-03-14 MED ORDER — ONDANSETRON HCL 4 MG PO TABS: 4.0000 mg | ORAL_TABLET | Freq: Four times a day (QID) | ORAL | Status: DC | PRN

## 2018-03-14 MED ORDER — EPHEDRINE 5 MG/ML INJ
INTRAVENOUS | Status: AC
Start: 2018-03-14 — End: ?
  Filled 2018-03-14: qty 10

## 2018-03-14 MED ORDER — OXYCODONE HCL 5 MG PO TABS: 10.0000 mg | ORAL_TABLET | ORAL | Status: DC | PRN

## 2018-03-14 MED ORDER — OXYCODONE HCL 5 MG PO TABS: 5.0000 mg | ORAL_TABLET | ORAL | Status: DC | PRN

## 2018-03-14 MED ORDER — KETOROLAC TROMETHAMINE 15 MG/ML IJ SOLN
15.0000 mg | Freq: Once | INTRAMUSCULAR | Status: AC
  Administered 2018-03-14: 15 mg via INTRAVENOUS
  Filled 2018-03-14: qty 1

## 2018-03-14 MED ORDER — ENOXAPARIN SODIUM 40 MG/0.4ML ~~LOC~~ SOLN
40.0000 mg | SUBCUTANEOUS | Status: DC
  Administered 2018-03-15: 40 mg via SUBCUTANEOUS
  Filled 2018-03-14: qty 0.4

## 2018-03-14 MED ORDER — ROPIVACAINE HCL 5 MG/ML IJ SOLN
INTRAMUSCULAR | Status: DC | PRN
  Administered 2018-03-14 (×7): 5 mL via PERINEURAL
  Administered 2018-03-14: 5 mL via EPIDURAL
  Administered 2018-03-14 (×3): 5 mL via PERINEURAL

## 2018-03-14 MED ORDER — VANCOMYCIN HCL 500 MG IV SOLR
INTRAVENOUS | Status: DC | PRN
  Administered 2018-03-14: 500 mg

## 2018-03-14 MED ORDER — FENTANYL CITRATE (PF) 100 MCG/2ML IJ SOLN
INTRAMUSCULAR | Status: AC
  Administered 2018-03-14: 100 ug
  Filled 2018-03-14: qty 2

## 2018-03-14 MED ORDER — PHENYLEPHRINE HCL 10 MG/ML IJ SOLN
INTRAVENOUS | Status: DC | PRN
  Administered 2018-03-14: 25 ug/min via INTRAVENOUS

## 2018-03-14 MED ORDER — EPHEDRINE SULFATE-NACL 50-0.9 MG/10ML-% IV SOSY
PREFILLED_SYRINGE | INTRAVENOUS | Status: DC | PRN
  Administered 2018-03-14: 10 mg via INTRAVENOUS
  Administered 2018-03-14: 5 mg via INTRAVENOUS
  Administered 2018-03-14 (×2): 10 mg via INTRAVENOUS
  Administered 2018-03-14 (×3): 5 mg via INTRAVENOUS

## 2018-03-14 MED ORDER — ONDANSETRON HCL 4 MG/2ML IJ SOLN
INTRAMUSCULAR | Status: AC
  Filled 2018-03-14: qty 2

## 2018-03-14 MED ORDER — PHENYLEPHRINE 40 MCG/ML (10ML) SYRINGE FOR IV PUSH (FOR BLOOD PRESSURE SUPPORT)
PREFILLED_SYRINGE | INTRAVENOUS | Status: AC
  Filled 2018-03-14: qty 10

## 2018-03-14 MED ORDER — FENTANYL CITRATE (PF) 250 MCG/5ML IJ SOLN
INTRAMUSCULAR | Status: AC
  Filled 2018-03-14: qty 5

## 2018-03-14 MED ORDER — PHENYLEPHRINE 40 MCG/ML (10ML) SYRINGE FOR IV PUSH (FOR BLOOD PRESSURE SUPPORT)
PREFILLED_SYRINGE | INTRAVENOUS | Status: DC | PRN
  Administered 2018-03-14: 40 ug via INTRAVENOUS
  Administered 2018-03-14 (×2): 80 ug via INTRAVENOUS
  Administered 2018-03-14: 40 ug via INTRAVENOUS
  Administered 2018-03-14: 80 ug via INTRAVENOUS
  Administered 2018-03-14 (×2): 40 ug via INTRAVENOUS

## 2018-03-14 MED ORDER — DOCUSATE SODIUM 100 MG PO CAPS
100.0000 mg | ORAL_CAPSULE | Freq: Two times a day (BID) | ORAL | Status: DC
  Filled 2018-03-14 (×2): qty 1

## 2018-03-14 MED ORDER — FENTANYL CITRATE (PF) 100 MCG/2ML IJ SOLN
INTRAMUSCULAR | Status: DC | PRN
  Administered 2018-03-14 (×4): 25 ug via INTRAVENOUS

## 2018-03-14 MED ORDER — FENTANYL CITRATE (PF) 100 MCG/2ML IJ SOLN: 25.0000 ug | INTRAMUSCULAR | Status: DC | PRN

## 2018-03-14 MED ORDER — LACTATED RINGERS IV SOLN
INTRAVENOUS | Status: DC
  Administered 2018-03-14 (×2): via INTRAVENOUS

## 2018-03-14 MED ORDER — METOPROLOL TARTRATE 100 MG PO TABS
100.0000 mg | ORAL_TABLET | Freq: Two times a day (BID) | ORAL | Status: DC
  Administered 2018-03-14 – 2018-03-15 (×2): 100 mg via ORAL
  Filled 2018-03-14 (×2): qty 1

## 2018-03-14 MED ORDER — PROPOFOL 10 MG/ML IV BOLUS
INTRAVENOUS | Status: AC
  Filled 2018-03-14: qty 20

## 2018-03-14 MED ORDER — ATORVASTATIN CALCIUM 10 MG PO TABS
10.0000 mg | ORAL_TABLET | Freq: Every day | ORAL | Status: DC
  Administered 2018-03-14: 10 mg via ORAL
  Filled 2018-03-14: qty 1

## 2018-03-14 MED ORDER — DEXAMETHASONE SODIUM PHOSPHATE 10 MG/ML IJ SOLN
INTRAMUSCULAR | Status: DC | PRN
  Administered 2018-03-14: 10 mg via INTRAVENOUS

## 2018-03-14 MED ORDER — HYDROMORPHONE HCL 2 MG/ML IJ SOLN: 0.5000 mg | INTRAMUSCULAR | Status: DC | PRN

## 2018-03-14 MED ORDER — VANCOMYCIN HCL 500 MG IV SOLR
INTRAVENOUS | Status: AC
Start: 2018-03-14 — End: ?
  Filled 2018-03-14: qty 500

## 2018-03-14 SURGICAL SUPPLY — 55 items
BANDAGE ESMARK 6X9 LF (GAUZE/BANDAGES/DRESSINGS) ×1 IMPLANT
BLADE OSC (BLADE) ×2 IMPLANT
BLADE RECIP (BLADE) ×2 IMPLANT
BLADE RECIPRO TAPERED (BLADE) ×3 IMPLANT
BLADE SURG 15 STRL LF DISP TIS (BLADE) ×2 IMPLANT
BLADE SURG 15 STRL SS (BLADE) ×6
BNDG CMPR 9X6 STRL LF SNTH (GAUZE/BANDAGES/DRESSINGS) ×1
BNDG ESMARK 6X9 LF (GAUZE/BANDAGES/DRESSINGS) ×3
CHLORAPREP W/TINT 26ML (MISCELLANEOUS) ×3 IMPLANT
CORE SLIDING POLY ANKLE 9MM (Orthopedic Implant) ×2 IMPLANT
COVER SURGICAL LIGHT HANDLE (MISCELLANEOUS) ×3 IMPLANT
CUFF TOURNIQUET SINGLE 34IN LL (TOURNIQUET CUFF) ×3 IMPLANT
DISPOSABLES PACK SBI (PACKS) ×2 IMPLANT
DRAPE C-ARM 42X72 X-RAY (DRAPES) ×3 IMPLANT
DRAPE C-ARMOR (DRAPES) ×3 IMPLANT
DRAPE HALF SHEET 40X57 (DRAPES) IMPLANT
DRAPE U-SHAPE 47X51 STRL (DRAPES) ×3 IMPLANT
DRSG MEPILEX BORDER 4X4 (GAUZE/BANDAGES/DRESSINGS) ×3 IMPLANT
DRSG MEPITEL 4X7.2 (GAUZE/BANDAGES/DRESSINGS) ×3 IMPLANT
DRSG PAD ABDOMINAL 8X10 ST (GAUZE/BANDAGES/DRESSINGS) ×6 IMPLANT
ELECT REM PT RETURN 9FT ADLT (ELECTROSURGICAL) ×3
ELECTRODE REM PT RTRN 9FT ADLT (ELECTROSURGICAL) ×1 IMPLANT
GAUZE SPONGE 4X4 12PLY STRL (GAUZE/BANDAGES/DRESSINGS) ×4 IMPLANT
GLOVE BIO SURGEON STRL SZ8 (GLOVE) ×3 IMPLANT
GLOVE BIOGEL PI IND STRL 8 (GLOVE) ×2 IMPLANT
GLOVE BIOGEL PI INDICATOR 8 (GLOVE) ×4
GLOVE ECLIPSE 8.0 STRL XLNG CF (GLOVE) ×3 IMPLANT
GOWN STRL REUS W/ TWL LRG LVL3 (GOWN DISPOSABLE) ×1 IMPLANT
GOWN STRL REUS W/ TWL XL LVL3 (GOWN DISPOSABLE) ×2 IMPLANT
GOWN STRL REUS W/TWL LRG LVL3 (GOWN DISPOSABLE) ×6 IMPLANT
GOWN STRL REUS W/TWL XL LVL3 (GOWN DISPOSABLE) ×6
IMPLANT TALAR STAR SZ S LF (Orthopedic Implant) ×2 IMPLANT
IMPLANT TIBIAL STAR SZ L (Orthopedic Implant) ×2 IMPLANT
KIT BASIN OR (CUSTOM PROCEDURE TRAY) ×3 IMPLANT
KIT TURNOVER KIT B (KITS) ×3 IMPLANT
NS IRRIG 1000ML POUR BTL (IV SOLUTION) ×3 IMPLANT
PACK ORTHO EXTREMITY (CUSTOM PROCEDURE TRAY) ×3 IMPLANT
PAD ABD 8X10 STRL (GAUZE/BANDAGES/DRESSINGS) ×2 IMPLANT
PAD ARMBOARD 7.5X6 YLW CONV (MISCELLANEOUS) ×6 IMPLANT
PAD CAST 4YDX4 CTTN HI CHSV (CAST SUPPLIES) ×3 IMPLANT
PADDING CAST COTTON 4X4 STRL (CAST SUPPLIES) ×3
PADDING CAST COTTON 6X4 STRL (CAST SUPPLIES) ×3 IMPLANT
SPLINT FIBERGLASS 4X15 (CAST SUPPLIES) ×2 IMPLANT
STOCKINETTE TUBULAR SYNTH 6IN (GAUZE/BANDAGES/DRESSINGS) ×3 IMPLANT
SUCTION FRAZIER TIP 10 FR DISP (SUCTIONS) IMPLANT
SURGILUBE 2OZ TUBE FLIPTOP (MISCELLANEOUS) ×3 IMPLANT
SUT ETHILON 3 0 PS 1 (SUTURE) ×6 IMPLANT
SUT MNCRL AB 3-0 PS2 18 (SUTURE) ×6 IMPLANT
SUT VIC AB 0 CT1 27 (SUTURE) ×6
SUT VIC AB 0 CT1 27XBRD ANBCTR (SUTURE) ×2 IMPLANT
TOWEL OR 17X24 6PK STRL BLUE (TOWEL DISPOSABLE) ×3 IMPLANT
TOWEL OR 17X26 10 PK STRL BLUE (TOWEL DISPOSABLE) ×3 IMPLANT
TUBE CONNECTING 12'X1/4 (SUCTIONS) ×1
TUBE CONNECTING 12X1/4 (SUCTIONS) ×2 IMPLANT
YANKAUER SUCT BULB TIP NO VENT (SUCTIONS) ×3 IMPLANT

## 2018-03-14 NOTE — Progress Notes (Signed)
Lunch relief by Renaye Rakers RN

## 2018-03-14 NOTE — Progress Notes (Signed)
Md on call to inform that patient home medication needs to be restarted tonight.Arthor Captain LPN

## 2018-03-14 NOTE — Evaluation (Signed)
Physical Therapy Evaluation Patient Details Name: [name] MRN: [id number] DOB: 1933/04/03 Today's Date: 03/14/2018   History of Present Illness  Pt is an 84 y.o. male s/p L total ankle arthroplasty and lateral ligament reconstruction on 03/14/18. PMH includes CVA, HTN, chronic LBP, R ankle fusion (2014), lumbar sx (2011).    Clinical Impression  Pt presents with an overall decrease in functional mobility secondary to above. PTA, pt lives alone and mod indep with SPC. Educ on precautions, positioning, therex, and importance of mobility. Today, pt able to amb short distance with RW, utilizing hop-to gait pattern to maintain LLE NWB; required minA to correct 2x posterior LOB. Pt anxious regarding NWB precautions throughout session. Pt would benefit from continued acute PT services to maximize functional mobility and independence prior to d/c with short-term SNF-level therapies; pt in agreement with this.     Follow Up Recommendations SNF;Supervision for mobility/OOB    Equipment Recommendations  None recommended by PT    Recommendations for Other Services       Precautions / Restrictions Precautions Precautions: Fall Restrictions Weight Bearing Restrictions: Yes LLE Weight Bearing: Non weight bearing Other Position/Activity Restrictions: Hard cast      Mobility  Bed Mobility Overal bed mobility: Needs Assistance Bed Mobility: Supine to Sit     Supine to sit: Supervision     General bed mobility comments: Increased time and effort  Transfers Overall transfer level: Needs assistance Equipment used: Rolling walker (2 wheeled) Transfers: Sit to/from Stand Sit to Stand: Min guard;From elevated surface         General transfer comment: Pt anxious to stand requiring max encouragement and cues for technique; able to with RW and min guard for balance. Increased time and effort  Ambulation/Gait Ambulation/Gait assistance: Min guard;Min assist Ambulation Distance (Feet): 4  Feet Assistive device: Rolling walker (2 wheeled) Gait Pattern/deviations: Step-to pattern;Trunk flexed;Leaning posteriorly Gait velocity: Decreased Gait velocity interpretation: <1.8 ft/sec, indicate of risk for recurrent falls General Gait Details: Amb from bed to recliner with RW, utilizing hop-to gait pattern on RLE to maintain LLE NWB. Min guard for balance, requiring 2x minA to prevent posterior LOB  Stairs            Wheelchair Mobility    Modified Rankin (Stroke Patients Only)       Balance Overall balance assessment: Needs assistance   Sitting balance-Leahy Scale: Good       Standing balance-Leahy Scale: Poor                               Pertinent Vitals/Pain Pain Assessment: No/denies pain(L ankle still numb)    Home Living Family/patient expects to be discharged to:: Skilled nursing facility Living Arrangements: Alone Available Help at Discharge: Available PRN/intermittently Type of Home: House Home Access: Stairs to enter Entrance Stairs-Rails: Left Entrance Stairs-Number of Steps: 2 Home Layout: One level Home Equipment: Walker - 2 wheels;Cane - single point Additional Comments: Plans to d/c to SNF    Prior Function Level of Independence: Independent with assistive device(s)         Comments: Mod indep with SPC for balance; reports chronic vertigo     Hand Dominance        Extremity/Trunk Assessment   Upper Extremity Assessment Upper Extremity Assessment: Overall WFL for tasks assessed    Lower Extremity Assessment Lower Extremity Assessment: LLE deficits/detail LLE Deficits / Details: s/p L total ankle arthroplasty; hip and knee  grossly 4-5/5     Cervical / Trunk Assessment Cervical / Trunk Assessment: Normal  Communication   Communication: HOH  Cognition Arousal/Alertness: Awake/alert Behavior During Therapy: WFL for tasks assessed/performed Overall Cognitive Status: No family/caregiver present to determine  baseline cognitive functioning Area of Impairment: Attention;Problem solving                   Current Attention Level: Sustained         Problem Solving: Slow processing;Requires verbal cues        General Comments      Exercises General Exercises - Lower Extremity Long Arc Quad: AROM;Left;10 reps;Seated Hip Flexion/Marching: AROM;Left;10 reps;Seated   Assessment/Plan    PT Assessment Patient needs continued PT services  PT Problem List Decreased activity tolerance;Decreased balance;Decreased mobility;Decreased knowledge of use of DME;Decreased knowledge of precautions       PT Treatment Interventions DME instruction;Gait training;Stair training;Functional mobility training;Therapeutic activities;Therapeutic exercise;Balance training;Patient/family education    PT Goals (Current goals can be found in the Care Plan section)  Acute Rehab PT Goals Patient Stated Goal: Get stronger at SNF before return home PT Goal Formulation: With patient Time For Goal Achievement: 03/28/18 Potential to Achieve Goals: Good    Frequency Min 3X/week   Barriers to discharge Decreased caregiver support      Co-evaluation               AM-PAC PT "6 Clicks" Daily Activity  Outcome Measure Difficulty turning over in bed (including adjusting bedclothes, sheets and blankets)?: A Little Difficulty moving from lying on back to sitting on the side of the bed? : A Little Difficulty sitting down on and standing up from a chair with arms (e.g., wheelchair, bedside commode, etc,.)?: Unable Help needed moving to and from a bed to chair (including a wheelchair)?: A Little Help needed walking in hospital room?: A Little Help needed climbing 3-5 steps with a railing? : A Lot 6 Click Score: 15    End of Session Equipment Utilized During Treatment: Gait belt Activity Tolerance: Patient tolerated treatment well;Patient limited by fatigue Patient left: in chair;with call bell/phone  within reach Nurse Communication: Mobility status PT Visit Diagnosis: Other abnormalities of gait and mobility (R26.89);Unsteadiness on feet (R26.81)    Time: 0086-7619 PT Time Calculation (min) (ACUTE ONLY): 26 min   Charges:   PT Evaluation $PT Eval Moderate Complexity: 1 Mod PT Treatments $Therapeutic Activity: 8-22 mins   PT G Codes:       Mabeline Caras, PT, DPT Acute Rehab Services  Pager: [place] 03/14/2018, 5:31 PM

## 2018-03-14 NOTE — Op Note (Signed)
03/14/2018  1:02 PM  PATIENT:  [name]  84 y.o. male  PRE-OPERATIVE DIAGNOSIS:  Left ankle arthritis  POST-OPERATIVE DIAGNOSIS:  Left ankle arthritis  Procedure(s): 1.  Left TOTAL ANKLE ARTHOPLASTY 2.  Left ankle lateral ligament reconstruction  SURGEON:  Wylene Simmer, MD  ASSISTANT: Mechele Claude, PA-C  ANESTHESIA:   General, regional  EBL:  minimal   TOURNIQUET:   Total Tourniquet Time Documented: Thigh (Left) - 91 minutes Total: Thigh (Left) - 91 minutes  COMPLICATIONS:  None apparent  DISPOSITION:  Extubated, awake and stable to recovery.  INDICATION FOR PROCEDURE: The patient is an 84 year old male without significant past medical history.  He has a long history of left chronic ankle pain due to arthritis.  He has failed nonoperative treatment to date including activity modification, oral anti-inflammatories, bracing and shoe wear modification.  He presents now for left total ankle replacement.  The risks and benefits of the alternative treatment options have been discussed in detail.  The patient wishes to proceed with surgery and specifically understands risks of bleeding, infection, nerve damage, blood clots, need for additional surgery, amputation and death.  PROCEDURE IN DETAIL:  After pre operative consent was obtained, and the correct operative site was identified, the patient was brought to the operating room and placed supine on the OR table.  Anesthesia was administered.  Pre-operative antibiotics were administered.  A surgical timeout was taken.  The left lower extremity was then prepped and draped in standard sterile fashion with a tourniquet around the thigh.  The extremity was exsanguinated and the tourniquet was inflated to 350 mmHg.  A longitudinal incision was made over the anterior ankle joint.  Dissection was carried down through the subcutaneous tissues.  The extensor retinaculum was incised over the EHL tendon and released proximally and distally.  The  interval between the tibialis anterior and extensor hallucis longus was then developed taking care to protect the neurovascular bundle.  The anterior joint capsule was incised and elevated medially and laterally.  End-stage arthrosis was noted.  Anterior osteophytes were resected.  The external alignment guide was applied and aligned with an osteotome in the medial gutter.  Once rotation was set then the guide was aligned in the anterior and lateral planes.  The appropriate resection level was set in the cutting guide was pinned into position.  The distal tibial cut was made and all bone removed.  The talar cutting guide was then applied and pinned appropriately.  The proximal talus cut was made and the bone removed.  The talus was sized as a small.  The anterior posterior and medial lateral cuts were made.  The window trial was applied and was noted to fit appropriately.  The keel hole was drilled and punched.  The wound was irrigated copiously and vancomycin powder placed on the talus.  The talar component was positioned appropriately and impacted into place.  The distal tibia cut was then sized as a large.  The large tibial trial was inserted and was noted to fit appropriately.  The barrel holes were drilled and punched.  The wound was irrigated copiously and vancomycin powder applied to the distal tibia.  The tibial component was inserted and impacted into position.  A trial spacer was inserted.  The patient was noted to have lateral ligament instability.  The trial poly-was removed and replaced with a 9 mm polyethylene spacer.  Lateral ligaments were then reconstructed.  The ATFL was mobilized and reduced to an appropriate position on  the anterior fibula.  It was repaired with Vicryl sutures.  The ligamentous balance was noted to be appropriate.  The wound was then irrigated copiously.  The barrel holes were grafted anteriorly.  The anterior joint capsule was repaired with Vicryl.  Final AP, mortise and  lateral radiographs were obtained showing appropriate position and length of all hardware in appropriate alignment of the ankle joint.  The extensor retinaculum was then repaired with 0 Vicryl.  Subcutaneous tissues were approximated with Monocryl.  Skin incision was closed with nylon.  Sterile dressings were applied followed by a well-padded short leg cast.  Tourniquet was released after application of the dressings.  The patient was awakened from anesthesia and transported to the recovery room in stable condition.   FOLLOW UP PLAN: Nonweightbearing on the left lower extremity.  Plan discharge to a skilled nursing facility.  Physical therapy and outpatient therapy consultation will be obtained.  Lovenox for DVT prophylaxis while inpatient and then aspirin as an outpatient.    Mechele Claude PA-C was present and scrubbed for the duration of the operative case. His assistance was essential in positioning the patient, prepping and draping, gaining and maintaining exposure, performing the operation, closing and dressing the wounds and applying the splint.

## 2018-03-14 NOTE — Plan of Care (Signed)
  Problem: Nutrition: Goal: Adequate nutrition will be maintained Outcome: Progressing   Problem: Elimination: Goal: Will not experience complications related to bowel motility Outcome: Progressing   Problem: Pain Managment: Goal: General experience of comfort will improve Outcome: Progressing   Problem: Safety: Goal: Ability to remain free from injury will improve Outcome: Progressing   

## 2018-03-14 NOTE — Anesthesia Procedure Notes (Signed)
Procedure Name: LMA Insertion Date/Time: 03/14/2018 10:41 AM Performed by: Gwyndolyn Saxon, CRNA Pre-anesthesia Checklist: Patient identified, Emergency Drugs available, Suction available, Patient being monitored and Timeout performed Patient Re-evaluated:Patient Re-evaluated prior to induction Oxygen Delivery Method: Circle system utilized Preoxygenation: Pre-oxygenation with 100% oxygen Induction Type: IV induction Ventilation: Mask ventilation without difficulty LMA: LMA inserted LMA Size: 4.0 Number of attempts: 1 Placement Confirmation: positive ETCO2,  CO2 detector and breath sounds checked- equal and bilateral Tube secured with: Tape Dental Injury: Teeth and Oropharynx as per pre-operative assessment

## 2018-03-14 NOTE — Discharge Instructions (Signed)
Colton Simmer, MD [name]  Please read the following information regarding your care after surgery.  Medications  You only need a prescription for the narcotic pain medicine (ex. oxycodone, Percocet, Norco).  All of the other medicines listed below are available over the counter. X Aleve 2 pills twice a day for the first 3 days after surgery. X acetominophen (Tylenol) 650 mg every 4-6 hours as you need for minor to moderate pain X oxycodone as prescribed for severe pain  Narcotic pain medicine (ex. oxycodone, Percocet, Vicodin) will cause constipation.  To prevent this problem, take the following medicines while you are taking any pain medicine. X docusate sodium (Colace) 100 mg twice a day X senna (Senokot) 2 tablets twice a day  X To help prevent blood clots, take a baby aspirin (81 mg) twice a day after surgery.  You should also get up every hour while you are awake to move around.    Weight Bearing X Do not bear any weight on the operated leg or foot.  Cast / Splint / Dressing X Keep your splint, cast or dressing clean and dry.  Dont put anything (coat hanger, pencil, etc) down inside of it.  If it gets damp, use a hair dryer on the cool setting to dry it.  If it gets soaked, call the office to schedule an appointment for a cast change.    After your dressing, cast or splint is removed; you may shower, but do not soak or scrub the wound.  Allow the water to run over it, and then gently pat it dry.  Swelling It is normal for you to have swelling where you had surgery.  To reduce swelling and pain, keep your toes above your nose for at least 3 days after surgery.  It may be necessary to keep your foot or leg elevated for several weeks.  If it hurts, it should be elevated.  Follow Up Call my office at [phone] when you are discharged from the hospital or surgery center to schedule an appointment to be seen two weeks after surgery.  Call my office at [phone] if  you develop a fever >101.5 F, nausea, vomiting, bleeding from the surgical site or severe pain.

## 2018-03-14 NOTE — Anesthesia Procedure Notes (Signed)
Anesthesia Regional Block: Adductor canal block   Pre-Anesthetic Checklist: ,, timeout performed,, Correct Site, Correct Laterality, Correct Procedure, Correct Position, site marked, risks and benefits discussed, Surgical consent,  Pre-op evaluation,  At surgeon's request and post-op pain management  Laterality: Left and Lower  Prep: chloraprep       Needles:  Injection technique: Single-shot  Needle Type: Echogenic Stimulator Needle     Needle Length: 10cm  Needle Gauge: 21   Needle insertion depth: 3 cm   Additional Needles:   Procedures:,,,, ultrasound used (permanent image in chart),,,,  Narrative:  Start time: 03/14/2018 9:14 AM End time: 03/14/2018 9:22 AM Injection made incrementally with aspirations every 5 mL.  Performed by: Personally  Anesthesiologist: Lyn Hollingshead, MD

## 2018-03-14 NOTE — Transfer of Care (Signed)
Immediate Anesthesia Transfer of Care Note  Patient: [name]  Procedure(s) Performed: TOTAL ANKLE ARTHOPLASTY (Left )  Patient Location: PACU  Anesthesia Type:General  Level of Consciousness: awake, alert  and oriented  Airway & Oxygen Therapy: Patient Spontanous Breathing and Patient connected to face mask oxygen  Post-op Assessment: Report given to RN and Post -op Vital signs reviewed and stable  Post vital signs: Reviewed and stable  Last Vitals:  Vitals Value Taken Time  BP 145/90 03/14/2018  1:08 PM  Temp    Pulse 72 03/14/2018  1:09 PM  Resp 13 03/14/2018  1:09 PM  SpO2 95 % 03/14/2018  1:09 PM  Vitals shown include unvalidated device data.  Last Pain:  Vitals:   03/14/18 0902  TempSrc:   PainSc: 0-No pain      Patients Stated Pain Goal: 2 (63/14/97 0263)  Complications: No apparent anesthesia complications

## 2018-03-14 NOTE — Anesthesia Postprocedure Evaluation (Signed)
Anesthesia Post Note  Patient: [name]  Procedure(s) Performed: TOTAL ANKLE ARTHOPLASTY (Left )     Patient location during evaluation: PACU Anesthesia Type: General Level of consciousness: awake Pain management: pain level controlled Vital Signs Assessment: post-procedure vital signs reviewed and stable Respiratory status: spontaneous breathing Cardiovascular status: stable Postop Assessment: no apparent nausea or vomiting Anesthetic complications: no    Last Vitals:  Vitals:   03/14/18 1353 03/14/18 1400  BP: (!) 159/96   Pulse:  70  Resp:  17  Temp:    SpO2:  93%    Last Pain:  Vitals:   03/14/18 1400  TempSrc:   PainSc: 0-No pain   Pain Goal: Patients Stated Pain Goal: 2 (03/14/18 0902)               Bassel Gaskill JR,JOHN [place]

## 2018-03-14 NOTE — Anesthesia Procedure Notes (Signed)
Anesthesia Regional Block: Popliteal block   Pre-Anesthetic Checklist: ,, timeout performed, Correct Patient, Correct Site, Correct Laterality, Correct Procedure, Correct Position, site marked, Risks and benefits discussed,  Surgical consent,  Pre-op evaluation,  At surgeon's request and post-op pain management  Laterality: Left and Lower  Prep: chloraprep       Needles:  Injection technique: Single-shot  Needle Type: Echogenic Stimulator Needle     Needle Length: 10cm  Needle Gauge: 21   Needle insertion depth: 2 cm   Additional Needles:   Procedures:,,,, ultrasound used (permanent image in chart),,,,  Narrative:  Start time: 03/14/2018 9:25 AM End time: 03/14/2018 9:33 AM Injection made incrementally with aspirations every 5 mL.  Performed by: Personally  Anesthesiologist: Lyn Hollingshead, MD

## 2018-03-14 NOTE — H&P (Signed)
[name] is an 84 y.o. male.   Chief Complaint:  Left ankle pain HPI: The patient is an 84 year old male with a long history of left ankle pain due to arthritis.  He has failed nonoperative treatment to date including activity modification, oral anti-inflammatories, bracing and shoe wear modifications.  He presents now for operative treatment of this painful condition.  Past Medical History:  Diagnosis Date  . Arthritis    "right ankle" (05/11/2013)  . Chronic lower back pain   . History of kidney stones   . Hypertension   . Sleep apnea    "had study; then OR; never wore mask" (05/11/2013)  . Stroke [facility]) 1979   "fully recovered since then" (05/11/2013)    Past Surgical History:  Procedure Laterality Date  . ANKLE FUSION Right 05/10/2013   Procedure: ARTHRODESIS ANKLE;  Surgeon: Wylene Simmer, MD;  Location: [facility];  Service: Orthopedics;  Laterality: Right;  . BACK SURGERY  2011  . CARPAL TUNNEL RELEASE Right 04/15/2010  . LITHOTRIPSY  ~ 2009  . NASAL SINUS SURGERY Bilateral 2004  . POSTERIOR LAMINECTOMY / DECOMPRESSION LUMBAR SPINE  2011   Dr. Shellia Carwin  . REPAIR OF PERFORATED ULCER  1979  . TENDON RELEASE Right 05/10/2013   Procedure: PERCUTANEOUS ACHILLES LENGTHENING;  Surgeon: Wylene Simmer, MD;  Location: [facility];  Service: Orthopedics;  Laterality: Right;  . TONSILLECTOMY  1940's    History reviewed. No pertinent family history. Social History:  reports that he quit smoking about 38 years ago. His smoking use included cigarettes. He has a 25.00 pack-year smoking history. He quit smokeless tobacco use about 29 years ago. His smokeless tobacco use included chew. He reports that he drinks about 4.2 oz of alcohol per week. He reports that he does not use drugs.  Allergies: No Known Allergies  Medications Prior to Admission  Medication Sig Dispense Refill  . aspirin EC 81 MG tablet Take 81 mg by mouth daily.    Marland Kitchen atorvastatin (LIPITOR) 10 MG tablet Take 10 mg by mouth at bedtime.      Marland Kitchen losartan (COZAAR) 100 MG tablet Take 100 mg by mouth at bedtime.    . metoprolol (LOPRESSOR) 100 MG tablet Take 100 mg by mouth 2 (two) times daily.    . Multiple Vitamin (MULTIVITAMIN WITH MINERALS) TABS Take 1 tablet by mouth daily.      No results found for this or any previous visit (from the past 48 hour(s)). No results found.  ROS no recent fever, chills, nausea, vomiting or changes in his appetite.  Blood pressure (!) 180/69, pulse 82, temperature 97.7 F (36.5 C), temperature source Oral, resp. rate 20, height 5\' 10"  (1.778 m), weight 84.4 kg (186 lb), SpO2 98 %. Physical Exam  Well-nourished well-developed man in no apparent distress.  Alert and oriented x4.  Mood and affect are normal.  Extraocular motions are intact.  Respirations are unlabored.  Gait is antalgic to the left.  The left ankle has healthy and intact skin.  Pulses are palpable.  No lymphadenopathy.  5 out of 5 strength in plantar flexion and dorsi flexion of the ankle and toes.  He is tender to palpation across the anterior joint line.  Sensibility to light touch is intact dorsally and plantarly at the forefoot.  Assessment/Plan Left ankle arthritis -to the operating room today for left total ankle replacement.  The risks and benefits of the alternative treatment options have been discussed in detail.  The patient wishes to proceed  with surgery and specifically understands risks of bleeding, infection, nerve damage, blood clots, need for additional surgery, amputation and death.   Wylene Simmer, MD 25-Mar-2018, 10:05 AM

## 2018-03-14 NOTE — Anesthesia Preprocedure Evaluation (Addendum)
Anesthesia Evaluation  Patient identified by MRN, date of birth, ID band Patient awake    Reviewed: Allergy & Precautions, NPO status , Patient's Chart, lab work & pertinent test results, reviewed documented beta blocker date and time   Airway Mallampati: I       Dental no notable dental hx. (+) Teeth Intact   Pulmonary former smoker,    Pulmonary exam normal breath sounds clear to auscultation       Cardiovascular hypertension, Pt. on medications and Pt. on home beta blockers Normal cardiovascular exam Rhythm:Regular Rate:Normal     Neuro/Psych negative psych ROS   GI/Hepatic Neg liver ROS,   Endo/Other  negative endocrine ROS  Renal/GU negative Renal ROS  negative genitourinary   Musculoskeletal  (+) Arthritis , Osteoarthritis,    Abdominal Normal abdominal exam  (+)   Peds  Hematology negative hematology ROS (+)   Anesthesia Other Findings   Carvel Getting, NP Nurse Practitioner Anesthesiology Progress Notes   Addendum Date of Service:  03/02/2018 11:59 PM    Related encounter: Pre-Admission Testing 46 from 03/02/2018 in [facility] PREADMISSION TESTING     Anesthesia Chart Review:  Pt is an 84 year old male scheduled for L total ankle arthroplasty on 03/14/2018 with Wylene Simmer, MD  - PCP is Nelda Bucks, MD who signed letter of clearance for surgery.    PMH includes:  HTN, stroke (1979), OSA. Former smoker (quit 1980). BMI 27. S/p R ankle arthrosdesis 05/10/13  Medications include: ASA 81 mg, Lipitor, losartan, metoprolol  BP (!) 148/91   Pulse 63   Temp 36.7 C   Resp 20   Ht 5\' 10"  (1.778 m)   Wt 186 lb (84.4 kg)   SpO2 95%   BMI 26.69 kg/m   Preoperative labs reviewed.    EKG 03/02/18: NSR.  Inferior infarct, age undetermined. New q waves in III and aVF; prior EKG 05/08/13 showed small R waves in III and AVF (comparison EKG on paper chart)  Reviewed case  with Dr. Lyndle Herrlich.   If no changes, I anticipate pt can proceed with surgery as scheduled.   Willeen Cass, FNP-BC [facility]/Anesthesiology Phone: [phone] 03/03/2018 3:21 PM          Reproductive/Obstetrics                            Anesthesia Physical Anesthesia Plan  ASA: II  Anesthesia Plan: General   Post-op Pain Management:  Regional for Post-op pain   Induction: Intravenous  PONV Risk Score and Plan:   Airway Management Planned: LMA  Additional Equipment:   Intra-op Plan:   Post-operative Plan:   Informed Consent: I have reviewed the patients History and Physical, chart, labs and discussed the procedure including the risks, benefits and alternatives for the proposed anesthesia with the patient or authorized representative who has indicated his/her understanding and acceptance.   Dental advisory given  Plan Discussed with: CRNA and Surgeon  Anesthesia Plan Comments:         Anesthesia Quick Evaluation

## 2018-03-15 ENCOUNTER — Encounter (HOSPITAL_COMMUNITY): Payer: Self-pay | Admitting: Orthopedic Surgery

## 2018-03-15 DIAGNOSIS — H02729 Madarosis of unspecified eye, unspecified eyelid and periocular area: Secondary | ICD-10-CM | POA: Diagnosis not present

## 2018-03-15 DIAGNOSIS — Z471 Aftercare following joint replacement surgery: Secondary | ICD-10-CM | POA: Diagnosis not present

## 2018-03-15 DIAGNOSIS — M19072 Primary osteoarthritis, left ankle and foot: Secondary | ICD-10-CM | POA: Diagnosis not present

## 2018-03-15 DIAGNOSIS — G8911 Acute pain due to trauma: Secondary | ICD-10-CM | POA: Diagnosis not present

## 2018-03-15 DIAGNOSIS — M79672 Pain in left foot: Secondary | ICD-10-CM | POA: Diagnosis not present

## 2018-03-15 DIAGNOSIS — M25562 Pain in left knee: Secondary | ICD-10-CM | POA: Diagnosis not present

## 2018-03-15 DIAGNOSIS — Z96662 Presence of left artificial ankle joint: Secondary | ICD-10-CM | POA: Diagnosis not present

## 2018-03-15 DIAGNOSIS — S8990XA Unspecified injury of unspecified lower leg, initial encounter: Secondary | ICD-10-CM | POA: Diagnosis not present

## 2018-03-15 DIAGNOSIS — M4800 Spinal stenosis, site unspecified: Secondary | ICD-10-CM | POA: Diagnosis not present

## 2018-03-15 DIAGNOSIS — R262 Difficulty in walking, not elsewhere classified: Secondary | ICD-10-CM | POA: Diagnosis not present

## 2018-03-15 MED ORDER — DOCUSATE SODIUM 100 MG PO CAPS: 100.0000 mg | ORAL_CAPSULE | Freq: Two times a day (BID) | ORAL | 0 refills | Status: DC

## 2018-03-15 MED ORDER — ASPIRIN EC 81 MG PO TBEC: 81.0000 mg | DELAYED_RELEASE_TABLET | Freq: Two times a day (BID) | ORAL | 0 refills | Status: DC

## 2018-03-15 MED ORDER — SENNA 8.6 MG PO TABS: 2.0000 | ORAL_TABLET | Freq: Two times a day (BID) | ORAL | 0 refills | Status: DC

## 2018-03-15 MED ORDER — OXYCODONE HCL 5 MG PO TABS: 5.0000 mg | ORAL_TABLET | ORAL | 0 refills | Status: DC | PRN

## 2018-03-15 NOTE — Clinical Social Work Placement (Signed)
   CLINICAL SOCIAL WORK PLACEMENT  NOTE  Date:  03/15/2018  Patient Details  Name: [name] MRN: [id number] Date of Birth: 30-Jun-1933  Clinical Social Work is seeking post-discharge placement for this patient at the [place] level of care (*CSW will initial, date and re-position this form in  chart as items are completed):  Yes   Patient/family provided with [place] Work Department's list of facilities offering this level of care within the geographic area requested by the patient (or if unable, by the patient's family).  Yes   Patient/family informed of their freedom to choose among providers that offer the needed level of care, that participate in Medicare, Medicaid or managed care program needed by the patient, have an available bed and are willing to accept the patient.  Yes   Patient/family informed of [facility]'s ownership interest in [facility] and [facility], as well as of the fact that they are under no obligation to receive care at these facilities.  PASRR submitted to EDS on       PASRR number received on 03/14/18     Existing PASRR number confirmed on       FL2 transmitted to all facilities in geographic area requested by pt/family on 03/14/18     FL2 transmitted to all facilities within larger geographic area on       Patient informed that his/her managed care company has contracts with or will negotiate with certain facilities, including the following:        Yes   Patient/family informed of bed offers received.  Patient chooses bed at [place]     Physician recommends and patient chooses bed at      Patient to be transferred to [place] on 03/15/18.  Patient to be transferred to facility by [facility]     Patient family notified on 03/15/18 of transfer.  Name of family member notified:  spoke with son Darren     PHYSICIAN Please prepare priority discharge summary, including medications      Additional Comment:    _______________________________________________ Normajean Baxter, LCSW 03/15/2018, 10:46 AM

## 2018-03-15 NOTE — Discharge Summary (Signed)
Physician Discharge Summary  Patient ID: [name] MRN: [id number] DOB/AGE: 01/09/1933 84 y.o.  Admit date: 03/14/2018 Discharge date: 03/15/2018  Admission Diagnoses:  L ankle osteoarthritis; ptosis; spinal stenosis; gonalgia; cervical spondylosis with myelopathy; hammertoe; ingrown toenail; left knee meniscus tear; HTN; hyperlipidemia  Discharge Diagnoses:  Active Problems:   H/O total ankle replacement, left same as above  Discharged Condition: stable  Hospital Course: Patient presented to [facility] on 03/14/18 for elective L Total Ankle Replacement by Dr. Wylene Simmer.  The patient tolerated the procedure well without complication.  He was then admitted to the hospital.  He tolerated his stay well, and successfully worked with therapy.  He is to be D/C'd on 03/15/18 to SNF.  Consults: PT/OT/Case management  Significant Diagnostic Studies: radiology: X-Ray: to ensure satisfactory anatomic alignment during operative procedure.  Treatments: IV hydration, antibiotics: Ancef, analgesia: acetaminophen, Vicodin and Dilaudid, cardiac meds: losartan, anticoagulation: lovenox and surgery: as stated above.  Discharge Exam: Blood pressure 126/72, pulse 67, temperature 98 F (36.7 C), temperature source Oral, resp. rate 16, height 5\' 10"  (1.778 m), weight 84.4 kg (186 lb), SpO2 95 %. General: WDWN patient in NAD. Psych:  Appropriate mood and affect. Neuro:  A&O x 3, Moving all extremities, sensation intact to light touch HEENT:  EOMs intact Chest:  Even non-labored respirations Skin: SLC C/D/I, no rashes or lesions Extremities: warm/dry, mild edema, no erythema or echymosis.  No lymphadenopathy. Pulses: Popliteus 2+ MSK:  ROM: EHL/FHL intact, MMT: able to perform quad set   Disposition:  SNF  Discharge Instructions    Call MD / Call 911   Complete by:  As directed    If you experience chest pain or shortness of breath, CALL 911 and be transported to the hospital emergency room.   If you develope a fever above 101 F, pus (white drainage) or increased drainage or redness at the wound, or calf pain, call your surgeon's office.   Constipation Prevention   Complete by:  As directed    Drink plenty of fluids.  Prune juice may be helpful.  You may use a stool softener, such as Colace (over the counter) 100 mg twice a day.  Use MiraLax (over the counter) for constipation as needed.   Diet - low sodium heart healthy   Complete by:  As directed    Increase activity slowly as tolerated   Complete by:  As directed    Non weight bearing   Complete by:  As directed    Laterality:  left   Extremity:  Lower     Allergies as of 03/15/2018   No Known Allergies     Medication List    TAKE these medications   aspirin EC 81 MG tablet Take 1 tablet (81 mg total) by mouth 2 (two) times daily. What changed:  when to take this   atorvastatin 10 MG tablet Commonly known as:  LIPITOR Take 10 mg by mouth at bedtime.   docusate sodium 100 MG capsule Commonly known as:  COLACE Take 1 capsule (100 mg total) by mouth 2 (two) times daily. While taking narcotic pain medicine.   losartan 100 MG tablet Commonly known as:  COZAAR Take 100 mg by mouth at bedtime.   metoprolol tartrate 100 MG tablet Commonly known as:  LOPRESSOR Take 100 mg by mouth 2 (two) times daily.   multivitamin with minerals Tabs tablet Take 1 tablet by mouth daily.   oxyCODONE 5 MG immediate release tablet Commonly known  as:  ROXICODONE Take 1 tablet (5 mg total) by mouth every 4 (four) hours as needed for moderate pain or severe pain. For no more than 5 days.   senna 8.6 MG Tabs tablet Commonly known as:  SENOKOT Take 2 tablets (17.2 mg total) by mouth 2 (two) times daily.            Discharge Care Instructions  (From admission, onward)        Start     Ordered   03/15/18 0000  Non weight bearing    Question Answer Comment  Laterality left   Extremity Lower      03/15/18 8882      Follow-up Information    Wylene Simmer, MD. Schedule an appointment as soon as possible for a visit in 3 week(s).   Specialty:  Orthopedic Surgery Contact information: [address] [place] [address] [phone]           Signed: Mohammed Kindle Office:  [phone]

## 2018-03-15 NOTE — NC FL2 (Signed)
[place] LEVEL OF CARE SCREENING TOOL     IDENTIFICATION  Patient Name: [name] Birthdate: 18-Jan-1933 Sex: male Admission Date (Current Location): 03/14/2018  [facility] and [place] Number:  [employer] and Address:         Provider Number: [id number]  Attending Physician Name and Address:  Wylene Simmer, MD  Relative Name and Phone Number:  Reyce Lubeck, son, [phone]    Current Level of Care: Hospital Recommended Level of Care: [place] Prior Approval Number:    Date Approved/Denied:   PASRR Number: [id number]  Discharge Plan:      Current Diagnoses: Patient Active Problem List   Diagnosis Date Noted  . H/O total ankle replacement, left 03/14/2018  . Hammertoe of left foot 12/20/2016  . Metatarsalgia, left foot 12/20/2016  . 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 Acquired pes planus of both feet 08/26/2016  . Dislocation of left knee with lateral meniscus tear 06/03/2016  . Ankle arthritis 05/06/2016  . Ingrowing nail 01/28/2016  . Ingrown nail of great toe of left foot 01/28/2016  . History of arthroscopy of knee 03/31/2015  . S/P left knee arthroscopy 03/31/2015  . Knee torn cartilage 03/13/2015  . Tear of meniscus of left knee 03/13/2015  . Gonalgia 02/24/2015  . Ptosis, both eyelids 11/13/2014  . Spinal stenosis in cervical region 10/10/2013  . Disease of spinal cord ([place]) 10/10/2013  . Cervical spondylosis with myelopathy 10/10/2013  . Degeneration of intervertebral disc of lumbosacral region 04/28/2013    Orientation RESPIRATION BLADDER Height & Weight     Self, Time, Situation, Place  Normal Continent Weight: 186 lb (84.4 kg) Height:  5\' 10"  (177.8 cm)  BEHAVIORAL SYMPTOMS/MOOD NEUROLOGICAL BOWEL NUTRITION STATUS      Continent Diet  AMBULATORY STATUS COMMUNICATION OF NEEDS Skin   Limited Assist Verbally Surgical wounds                       Personal Care Assistance Level of  Assistance  Dressing, Feeding   Feeding assistance: Limited assistance Dressing Assistance: Limited assistance     Functional Limitations Info  Sight, Hearing, Speech Sight Info: Adequate Hearing Info: Adequate Speech Info: Adequate    SPECIAL CARE FACTORS FREQUENCY  PT (By licensed PT), OT (By licensed OT)     PT Frequency: 3x week OT Frequency: 3x week            Contractures      Additional Factors Info  Code Status, Allergies Code Status Info: Full Allergies Info: No Known Allergies           Current Medications (03/15/2018):  This is the current hospital active medication list Current Facility-Administered Medications  Medication Dose Route Frequency Provider Last Rate Last Dose  . 0.9 %  sodium chloride infusion   Intravenous Continuous Corky Sing, [place] 75 mL/hr at 03/15/18 0645 75 mL/hr at 03/15/18 0645  . acetaminophen (TYLENOL) tablet 325-650 mg  325-650 mg Oral Q6H PRN Corky Sing, PA-C      . atorvastatin (LIPITOR) tablet 10 mg  10 mg Oral Shearon Stalls, MD   10 mg at 03/14/18 2344  . diphenhydrAMINE (BENADRYL) 12.5 MG/5ML elixir 12.5-25 mg  12.5-25 mg Oral Q4H PRN Corky Sing, PA-C      . docusate sodium (COLACE) capsule 100 mg  100 mg Oral BID Mechele Claude Pike, PA-C      . enoxaparin (LOVENOX) injection  40 mg  40 mg Subcutaneous Q24H Corky Sing, [place]   40 mg at 03/15/18 8295  . HYDROmorphone (DILAUDID) injection 0.5-1 mg  0.5-1 mg Intravenous Q4H PRN Corky Sing, PA-C      . losartan (COZAAR) tablet 100 mg  100 mg Oral Shearon Stalls, MD   100 mg at 03/14/18 2347  . metoprolol tartrate (LOPRESSOR) tablet 100 mg  100 mg Oral BID Wylene Simmer, MD   100 mg at 03/15/18 0825  . ondansetron (ZOFRAN) tablet 4 mg  4 mg Oral Q6H PRN Corky Sing, PA-C       Or  . ondansetron [facility]) injection 4 mg  4 mg Intravenous Q6H PRN Corky Sing, PA-C      . oxyCODONE (Oxy IR/ROXICODONE) immediate release tablet 10-15  mg  10-15 mg Oral Q4H PRN Corky Sing, PA-C      . oxyCODONE (Oxy IR/ROXICODONE) immediate release tablet 5-10 mg  5-10 mg Oral Q4H PRN Corky Sing, PA-C      . senna (SENOKOT) tablet 8.6 mg  1 tablet Oral BID Corky Sing, PA-C         Discharge Medications: Please see discharge summary for a list of discharge medications.  Relevant Imaging Results:  Relevant Lab Results:   Additional Information SS#: [place], LCSW

## 2018-03-15 NOTE — Progress Notes (Signed)
All questions and concerns addressed, Pt denies pain. Called facility and gave report to [facility], Pt to discharge with belongings via [place].

## 2018-03-15 NOTE — Clinical Social Work Note (Signed)
Clinical Social Work Assessment  Patient Details  Name: [name] MRN: [id number] Date of Birth: December 07, 1932  Date of referral:  03/15/18               Reason for consult:  Facility Placement                Permission sought to share information with:  Chartered certified accountant granted to share information::  Yes, Verbal Permission Granted  Name::     Dentist::  SNF  Relationship::  son  Contact Information:     Housing/Transportation Living arrangements for the past 2 months:  Single Family Home Source of Information:  Patient Patient Interpreter Needed:  None Criminal Activity/Legal Involvement Pertinent to Current Situation/Hospitalization:    Significant Relationships:  Adult Children, Siblings, Other Family Members Lives with:  Self Do you feel safe going back to the place where you live?  No Need for family participation in patient care:  Yes (Comment)  Care giving concerns:  Pt resides alone and will need short term rehab. Pt uses wheelchair at baseline.  Social Worker assessment / plan:  CSW met with patient at bedside to discuss SNF process and placement as he resides alone and will need SNF. Pt indicated that he would like to go to [facility] as it is near his home. CSW explored other SNF's and obtained permission to send to other SNF's in the area. Pt indicated that while he was home ambulated with wheelchair at home and was able to bath self with the aid of a shower chair. He did not prepare meal and order out. He does have some support from his son. Pt agreeable to SNF. CSW discussed the Insurance auth process as well. Pt in agreement.  CSW will f/u for disposition.  Employment status:  Retired Nurse, adult PT Recommendations:  [place] / Referral to community resources:  [place]  Patient/Family's Response to care:  Patient thanked CSW for meeting to discuss  disposition. Pt agreeable to SNF at discharge.  Patient/Family's Understanding of and Emotional Response to Diagnosis, Current Treatment, and Prognosis:  Pt has good understanding of impairment and diagnosis and patient agreeable to SNF at discharge. Pt plans to return to home after discharge from rehabilitative therapies. Pt has supportive family in the community. No issues or concerns identified. CSW will continue to follow for disposition.  Emotional Assessment Appearance:  Appears stated age Attitude/Demeanor/Rapport:  (Cooperative) Affect (typically observed):  Accepting, Appropriate Orientation:  Oriented to Situation, Oriented to  Time, Oriented to Place, Oriented to Self Alcohol / Substance use:  Not Applicable Psych involvement (Current and /or in the community):  No (Comment)  Discharge Needs  Concerns to be addressed:  Discharge Planning Concerns Readmission within the last 30 days:  No Current discharge risk:  Dependent with Mobility, Physical Impairment, Lives alone Barriers to Discharge:  No Barriers Identified   Colton Baxter, LCSW 03/15/2018, 9:42 AM

## 2018-03-15 NOTE — Social Work (Addendum)
CSW received a call back from [place] in admissions at [facility] indicating that she can offer a SNF bed. CSW confirmed that SNF will initiate Insurance Auth.   CSW will f/u for auth and assist with disposition.  CSW spoke with son Evangeline Gula at [phone] and confirmed SNF and transport.  Elissa Hefty, LCSW Clinical Social Worker [phone]

## 2018-03-15 NOTE — Social Work (Signed)
Clinical Social Worker facilitated patient discharge including contacting patient family and facility to confirm patient discharge plans.  Clinical information faxed to facility and family agreeable with plan.    CSW arranged ambulance transport via [facility] to [facility].    RN to call [phone] to give report prior to discharge.  Pt going to Room 710.  Clinical Social Worker will sign off for now as social work intervention is no longer needed. Please consult [place] again if new need arises.  Elissa Hefty, LCSW Clinical Social Worker [phone]

## 2018-03-15 NOTE — Social Work (Signed)
CSW contacted SNF-[facility] and left message with admission staff to confirm that they received referral. CSW will f/u for SNF placement as SNF will need to obtain Insurance Auth if they can offer a SNF bed.  CSW will f/u.  Elissa Hefty, LCSW Clinical Social Worker [phone]

## 2018-03-15 NOTE — Plan of Care (Signed)
  Problem: Activity: °Goal: Risk for activity intolerance will decrease °Outcome: Progressing °  °Problem: Nutrition: °Goal: Adequate nutrition will be maintained °Outcome: Progressing °  °Problem: Elimination: °Goal: Will not experience complications related to bowel motility °Outcome: Progressing °  °Problem: Safety: °Goal: Ability to remain free from injury will improve °Outcome: Progressing °  °

## 2018-03-15 NOTE — Progress Notes (Signed)
Subjective: 1 Day Post-Op Procedure(s) (LRB): TOTAL ANKLE ARTHOPLASTY (Left)  Patient reports pain as mild to moderate.  Tolerating POs well.  Admits to flatus.  Denies fever, chills, N/V, CP, SOB.  Worked well with therapy.  Reports that he is ready for D/C to SNF.    Objective:   VITALS:  Temp:  [97 F (36.1 C)-98 F (36.7 C)] 98 F (36.7 C) (04/24 0408) Pulse Rate:  [63-82] 67 (04/24 0408) Resp:  [11-20] 16 (04/24 0408) BP: (126-180)/(69-98) 126/72 (04/24 0408) SpO2:  [93 %-98 %] 95 % (04/24 0408) Weight:  [84.4 kg (186 lb)] 84.4 kg (186 lb) (04/23 0902)  General: WDWN patient in NAD. Psych:  Appropriate mood and affect. Neuro:  A&O x 3, Moving all extremities, sensation intact to light touch HEENT:  EOMs intact Chest:  Even non-labored respirations Skin:  SLC C/D/I, no rashes or lesions Extremities: warm/dry, mild edema, no erythema or echymosis.  No lymphadenopathy. Pulses: Popliteus 2+ MSK:  ROM: EHL/FHL intact, MMT: able to perform quad set    LABS No results for input(s): HGB, WBC, PLT in the last 72 hours. No results for input(s): NA, K, CL, CO2, BUN, CREATININE, GLUCOSE in the last 72 hours. No results for input(s): LABPT, INR in the last 72 hours.   Assessment/Plan: 1 Day Post-Op Procedure(s) (LRB): TOTAL ANKLE ARTHOPLASTY (Left)  NWB L LE ASA for DVT prophylaxis upon D/C D/C to SNF today.  Accommodations previously made at [facility] in [place]. Scripts on chart Plan for 3 week outpatient post-op visit with Dr. Doran Durand.  Mechele Claude PA-C [facility] Office:  [phone]

## 2018-03-17 DIAGNOSIS — R262 Difficulty in walking, not elsewhere classified: Secondary | ICD-10-CM | POA: Diagnosis not present

## 2018-04-07 DIAGNOSIS — M4800 Spinal stenosis, site unspecified: Secondary | ICD-10-CM | POA: Diagnosis not present

## 2018-04-07 DIAGNOSIS — Z471 Aftercare following joint replacement surgery: Secondary | ICD-10-CM | POA: Diagnosis not present

## 2018-04-07 DIAGNOSIS — Z96662 Presence of left artificial ankle joint: Secondary | ICD-10-CM | POA: Diagnosis not present

## 2018-04-07 DIAGNOSIS — M1991 Primary osteoarthritis, unspecified site: Secondary | ICD-10-CM | POA: Diagnosis not present

## 2018-04-07 DIAGNOSIS — I10 Essential (primary) hypertension: Secondary | ICD-10-CM | POA: Diagnosis not present

## 2018-04-07 DIAGNOSIS — Z602 Problems related to living alone: Secondary | ICD-10-CM | POA: Diagnosis not present

## 2018-04-07 DIAGNOSIS — G4733 Obstructive sleep apnea (adult) (pediatric): Secondary | ICD-10-CM | POA: Diagnosis not present

## 2018-04-07 DIAGNOSIS — I69354 Hemiplegia and hemiparesis following cerebral infarction affecting left non-dominant side: Secondary | ICD-10-CM | POA: Diagnosis not present

## 2018-04-07 DIAGNOSIS — I739 Peripheral vascular disease, unspecified: Secondary | ICD-10-CM | POA: Diagnosis not present

## 2018-04-09 DIAGNOSIS — M1991 Primary osteoarthritis, unspecified site: Secondary | ICD-10-CM | POA: Diagnosis not present

## 2018-04-09 DIAGNOSIS — I739 Peripheral vascular disease, unspecified: Secondary | ICD-10-CM | POA: Diagnosis not present

## 2018-04-09 DIAGNOSIS — G4733 Obstructive sleep apnea (adult) (pediatric): Secondary | ICD-10-CM | POA: Diagnosis not present

## 2018-04-09 DIAGNOSIS — M4800 Spinal stenosis, site unspecified: Secondary | ICD-10-CM | POA: Diagnosis not present

## 2018-04-09 DIAGNOSIS — Z471 Aftercare following joint replacement surgery: Secondary | ICD-10-CM | POA: Diagnosis not present

## 2018-04-09 DIAGNOSIS — Z96662 Presence of left artificial ankle joint: Secondary | ICD-10-CM | POA: Diagnosis not present

## 2018-04-09 DIAGNOSIS — Z602 Problems related to living alone: Secondary | ICD-10-CM | POA: Diagnosis not present

## 2018-04-09 DIAGNOSIS — I10 Essential (primary) hypertension: Secondary | ICD-10-CM | POA: Diagnosis not present

## 2018-04-09 DIAGNOSIS — I69354 Hemiplegia and hemiparesis following cerebral infarction affecting left non-dominant side: Secondary | ICD-10-CM | POA: Diagnosis not present

## 2018-04-10 DIAGNOSIS — I739 Peripheral vascular disease, unspecified: Secondary | ICD-10-CM | POA: Diagnosis not present

## 2018-04-10 DIAGNOSIS — M4800 Spinal stenosis, site unspecified: Secondary | ICD-10-CM | POA: Diagnosis not present

## 2018-04-10 DIAGNOSIS — Z471 Aftercare following joint replacement surgery: Secondary | ICD-10-CM | POA: Diagnosis not present

## 2018-04-10 DIAGNOSIS — I10 Essential (primary) hypertension: Secondary | ICD-10-CM | POA: Diagnosis not present

## 2018-04-10 DIAGNOSIS — I69354 Hemiplegia and hemiparesis following cerebral infarction affecting left non-dominant side: Secondary | ICD-10-CM | POA: Diagnosis not present

## 2018-04-10 DIAGNOSIS — Z602 Problems related to living alone: Secondary | ICD-10-CM | POA: Diagnosis not present

## 2018-04-10 DIAGNOSIS — G4733 Obstructive sleep apnea (adult) (pediatric): Secondary | ICD-10-CM | POA: Diagnosis not present

## 2018-04-10 DIAGNOSIS — M1991 Primary osteoarthritis, unspecified site: Secondary | ICD-10-CM | POA: Diagnosis not present

## 2018-04-10 DIAGNOSIS — Z96662 Presence of left artificial ankle joint: Secondary | ICD-10-CM | POA: Diagnosis not present

## 2018-04-11 DIAGNOSIS — M19072 Primary osteoarthritis, left ankle and foot: Secondary | ICD-10-CM | POA: Diagnosis not present

## 2018-04-11 DIAGNOSIS — I10 Essential (primary) hypertension: Secondary | ICD-10-CM | POA: Diagnosis not present

## 2018-04-11 DIAGNOSIS — E119 Type 2 diabetes mellitus without complications: Secondary | ICD-10-CM | POA: Diagnosis not present

## 2018-04-11 DIAGNOSIS — E785 Hyperlipidemia, unspecified: Secondary | ICD-10-CM | POA: Diagnosis not present

## 2018-04-11 DIAGNOSIS — Z96662 Presence of left artificial ankle joint: Secondary | ICD-10-CM | POA: Diagnosis not present

## 2018-04-12 DIAGNOSIS — M1991 Primary osteoarthritis, unspecified site: Secondary | ICD-10-CM | POA: Diagnosis not present

## 2018-04-12 DIAGNOSIS — I69354 Hemiplegia and hemiparesis following cerebral infarction affecting left non-dominant side: Secondary | ICD-10-CM | POA: Diagnosis not present

## 2018-04-12 DIAGNOSIS — Z602 Problems related to living alone: Secondary | ICD-10-CM | POA: Diagnosis not present

## 2018-04-12 DIAGNOSIS — G4733 Obstructive sleep apnea (adult) (pediatric): Secondary | ICD-10-CM | POA: Diagnosis not present

## 2018-04-12 DIAGNOSIS — Z96662 Presence of left artificial ankle joint: Secondary | ICD-10-CM | POA: Diagnosis not present

## 2018-04-12 DIAGNOSIS — I10 Essential (primary) hypertension: Secondary | ICD-10-CM | POA: Diagnosis not present

## 2018-04-12 DIAGNOSIS — M4800 Spinal stenosis, site unspecified: Secondary | ICD-10-CM | POA: Diagnosis not present

## 2018-04-12 DIAGNOSIS — I739 Peripheral vascular disease, unspecified: Secondary | ICD-10-CM | POA: Diagnosis not present

## 2018-04-12 DIAGNOSIS — Z471 Aftercare following joint replacement surgery: Secondary | ICD-10-CM | POA: Diagnosis not present

## 2018-04-13 DIAGNOSIS — I10 Essential (primary) hypertension: Secondary | ICD-10-CM | POA: Diagnosis not present

## 2018-04-13 DIAGNOSIS — M1991 Primary osteoarthritis, unspecified site: Secondary | ICD-10-CM | POA: Diagnosis not present

## 2018-04-13 DIAGNOSIS — Z602 Problems related to living alone: Secondary | ICD-10-CM | POA: Diagnosis not present

## 2018-04-13 DIAGNOSIS — Z96662 Presence of left artificial ankle joint: Secondary | ICD-10-CM | POA: Diagnosis not present

## 2018-04-13 DIAGNOSIS — G4733 Obstructive sleep apnea (adult) (pediatric): Secondary | ICD-10-CM | POA: Diagnosis not present

## 2018-04-13 DIAGNOSIS — M4800 Spinal stenosis, site unspecified: Secondary | ICD-10-CM | POA: Diagnosis not present

## 2018-04-13 DIAGNOSIS — Z471 Aftercare following joint replacement surgery: Secondary | ICD-10-CM | POA: Diagnosis not present

## 2018-04-13 DIAGNOSIS — I69354 Hemiplegia and hemiparesis following cerebral infarction affecting left non-dominant side: Secondary | ICD-10-CM | POA: Diagnosis not present

## 2018-04-13 DIAGNOSIS — I739 Peripheral vascular disease, unspecified: Secondary | ICD-10-CM | POA: Diagnosis not present

## 2018-04-14 DIAGNOSIS — I69354 Hemiplegia and hemiparesis following cerebral infarction affecting left non-dominant side: Secondary | ICD-10-CM | POA: Diagnosis not present

## 2018-04-14 DIAGNOSIS — Z96662 Presence of left artificial ankle joint: Secondary | ICD-10-CM | POA: Diagnosis not present

## 2018-04-14 DIAGNOSIS — Z602 Problems related to living alone: Secondary | ICD-10-CM | POA: Diagnosis not present

## 2018-04-14 DIAGNOSIS — M4800 Spinal stenosis, site unspecified: Secondary | ICD-10-CM | POA: Diagnosis not present

## 2018-04-14 DIAGNOSIS — Z471 Aftercare following joint replacement surgery: Secondary | ICD-10-CM | POA: Diagnosis not present

## 2018-04-14 DIAGNOSIS — G4733 Obstructive sleep apnea (adult) (pediatric): Secondary | ICD-10-CM | POA: Diagnosis not present

## 2018-04-14 DIAGNOSIS — I739 Peripheral vascular disease, unspecified: Secondary | ICD-10-CM | POA: Diagnosis not present

## 2018-04-14 DIAGNOSIS — I10 Essential (primary) hypertension: Secondary | ICD-10-CM | POA: Diagnosis not present

## 2018-04-14 DIAGNOSIS — M1991 Primary osteoarthritis, unspecified site: Secondary | ICD-10-CM | POA: Diagnosis not present

## 2018-04-17 DIAGNOSIS — G4733 Obstructive sleep apnea (adult) (pediatric): Secondary | ICD-10-CM | POA: Diagnosis not present

## 2018-04-17 DIAGNOSIS — M4800 Spinal stenosis, site unspecified: Secondary | ICD-10-CM | POA: Diagnosis not present

## 2018-04-17 DIAGNOSIS — Z471 Aftercare following joint replacement surgery: Secondary | ICD-10-CM | POA: Diagnosis not present

## 2018-04-17 DIAGNOSIS — I10 Essential (primary) hypertension: Secondary | ICD-10-CM | POA: Diagnosis not present

## 2018-04-17 DIAGNOSIS — I69354 Hemiplegia and hemiparesis following cerebral infarction affecting left non-dominant side: Secondary | ICD-10-CM | POA: Diagnosis not present

## 2018-04-17 DIAGNOSIS — Z602 Problems related to living alone: Secondary | ICD-10-CM | POA: Diagnosis not present

## 2018-04-17 DIAGNOSIS — M1991 Primary osteoarthritis, unspecified site: Secondary | ICD-10-CM | POA: Diagnosis not present

## 2018-04-17 DIAGNOSIS — Z96662 Presence of left artificial ankle joint: Secondary | ICD-10-CM | POA: Diagnosis not present

## 2018-04-17 DIAGNOSIS — I739 Peripheral vascular disease, unspecified: Secondary | ICD-10-CM | POA: Diagnosis not present

## 2018-04-20 DIAGNOSIS — M4800 Spinal stenosis, site unspecified: Secondary | ICD-10-CM | POA: Diagnosis not present

## 2018-04-20 DIAGNOSIS — G4733 Obstructive sleep apnea (adult) (pediatric): Secondary | ICD-10-CM | POA: Diagnosis not present

## 2018-04-20 DIAGNOSIS — M1991 Primary osteoarthritis, unspecified site: Secondary | ICD-10-CM | POA: Diagnosis not present

## 2018-04-20 DIAGNOSIS — Z96662 Presence of left artificial ankle joint: Secondary | ICD-10-CM | POA: Diagnosis not present

## 2018-04-20 DIAGNOSIS — Z471 Aftercare following joint replacement surgery: Secondary | ICD-10-CM | POA: Diagnosis not present

## 2018-04-20 DIAGNOSIS — I69354 Hemiplegia and hemiparesis following cerebral infarction affecting left non-dominant side: Secondary | ICD-10-CM | POA: Diagnosis not present

## 2018-04-20 DIAGNOSIS — I739 Peripheral vascular disease, unspecified: Secondary | ICD-10-CM | POA: Diagnosis not present

## 2018-04-20 DIAGNOSIS — I10 Essential (primary) hypertension: Secondary | ICD-10-CM | POA: Diagnosis not present

## 2018-04-20 DIAGNOSIS — Z602 Problems related to living alone: Secondary | ICD-10-CM | POA: Diagnosis not present

## 2018-04-21 DIAGNOSIS — I739 Peripheral vascular disease, unspecified: Secondary | ICD-10-CM | POA: Diagnosis not present

## 2018-04-21 DIAGNOSIS — Z96662 Presence of left artificial ankle joint: Secondary | ICD-10-CM | POA: Diagnosis not present

## 2018-04-21 DIAGNOSIS — Z602 Problems related to living alone: Secondary | ICD-10-CM | POA: Diagnosis not present

## 2018-04-21 DIAGNOSIS — G4733 Obstructive sleep apnea (adult) (pediatric): Secondary | ICD-10-CM | POA: Diagnosis not present

## 2018-04-21 DIAGNOSIS — I69354 Hemiplegia and hemiparesis following cerebral infarction affecting left non-dominant side: Secondary | ICD-10-CM | POA: Diagnosis not present

## 2018-04-21 DIAGNOSIS — Z471 Aftercare following joint replacement surgery: Secondary | ICD-10-CM | POA: Diagnosis not present

## 2018-04-21 DIAGNOSIS — I10 Essential (primary) hypertension: Secondary | ICD-10-CM | POA: Diagnosis not present

## 2018-04-21 DIAGNOSIS — M4800 Spinal stenosis, site unspecified: Secondary | ICD-10-CM | POA: Diagnosis not present

## 2018-04-21 DIAGNOSIS — M1991 Primary osteoarthritis, unspecified site: Secondary | ICD-10-CM | POA: Diagnosis not present

## 2018-04-24 DIAGNOSIS — Z471 Aftercare following joint replacement surgery: Secondary | ICD-10-CM | POA: Diagnosis not present

## 2018-04-24 DIAGNOSIS — G4733 Obstructive sleep apnea (adult) (pediatric): Secondary | ICD-10-CM | POA: Diagnosis not present

## 2018-04-24 DIAGNOSIS — M1991 Primary osteoarthritis, unspecified site: Secondary | ICD-10-CM | POA: Diagnosis not present

## 2018-04-24 DIAGNOSIS — I69354 Hemiplegia and hemiparesis following cerebral infarction affecting left non-dominant side: Secondary | ICD-10-CM | POA: Diagnosis not present

## 2018-04-24 DIAGNOSIS — I739 Peripheral vascular disease, unspecified: Secondary | ICD-10-CM | POA: Diagnosis not present

## 2018-04-24 DIAGNOSIS — I10 Essential (primary) hypertension: Secondary | ICD-10-CM | POA: Diagnosis not present

## 2018-04-24 DIAGNOSIS — Z96662 Presence of left artificial ankle joint: Secondary | ICD-10-CM | POA: Diagnosis not present

## 2018-04-24 DIAGNOSIS — Z602 Problems related to living alone: Secondary | ICD-10-CM | POA: Diagnosis not present

## 2018-04-24 DIAGNOSIS — M4800 Spinal stenosis, site unspecified: Secondary | ICD-10-CM | POA: Diagnosis not present

## 2018-04-27 ENCOUNTER — Other Ambulatory Visit: Payer: Self-pay

## 2018-04-27 DIAGNOSIS — M1991 Primary osteoarthritis, unspecified site: Secondary | ICD-10-CM | POA: Diagnosis not present

## 2018-04-27 DIAGNOSIS — M4800 Spinal stenosis, site unspecified: Secondary | ICD-10-CM | POA: Diagnosis not present

## 2018-04-27 DIAGNOSIS — G4733 Obstructive sleep apnea (adult) (pediatric): Secondary | ICD-10-CM | POA: Diagnosis not present

## 2018-04-27 DIAGNOSIS — I69354 Hemiplegia and hemiparesis following cerebral infarction affecting left non-dominant side: Secondary | ICD-10-CM | POA: Diagnosis not present

## 2018-04-27 DIAGNOSIS — Z602 Problems related to living alone: Secondary | ICD-10-CM | POA: Diagnosis not present

## 2018-04-27 DIAGNOSIS — I739 Peripheral vascular disease, unspecified: Secondary | ICD-10-CM | POA: Diagnosis not present

## 2018-04-27 DIAGNOSIS — Z471 Aftercare following joint replacement surgery: Secondary | ICD-10-CM | POA: Diagnosis not present

## 2018-04-27 DIAGNOSIS — I10 Essential (primary) hypertension: Secondary | ICD-10-CM | POA: Diagnosis not present

## 2018-04-27 DIAGNOSIS — Z96662 Presence of left artificial ankle joint: Secondary | ICD-10-CM | POA: Diagnosis not present

## 2018-04-27 NOTE — Patient Outreach (Signed)
[facility]) Care Management  04/27/2018  KAULDER ZAHNER 12/17/1932 [id number]   Medication Adherence call to Mr. [place] patient is due for a refill on Atorvastatin 10 mg and Losartan 100 mg patient said he was at rehab for 3- 4 weeks they provide all his medication there. Patient is showing past due under [place].  [place] Management Direct Dial [phone]  Fax [phone] [email or web address]

## 2018-04-28 DIAGNOSIS — M4800 Spinal stenosis, site unspecified: Secondary | ICD-10-CM | POA: Diagnosis not present

## 2018-04-28 DIAGNOSIS — M1991 Primary osteoarthritis, unspecified site: Secondary | ICD-10-CM | POA: Diagnosis not present

## 2018-04-28 DIAGNOSIS — G4733 Obstructive sleep apnea (adult) (pediatric): Secondary | ICD-10-CM | POA: Diagnosis not present

## 2018-04-28 DIAGNOSIS — I10 Essential (primary) hypertension: Secondary | ICD-10-CM | POA: Diagnosis not present

## 2018-04-28 DIAGNOSIS — Z471 Aftercare following joint replacement surgery: Secondary | ICD-10-CM | POA: Diagnosis not present

## 2018-04-28 DIAGNOSIS — I69354 Hemiplegia and hemiparesis following cerebral infarction affecting left non-dominant side: Secondary | ICD-10-CM | POA: Diagnosis not present

## 2018-04-28 DIAGNOSIS — Z602 Problems related to living alone: Secondary | ICD-10-CM | POA: Diagnosis not present

## 2018-04-28 DIAGNOSIS — I739 Peripheral vascular disease, unspecified: Secondary | ICD-10-CM | POA: Diagnosis not present

## 2018-04-28 DIAGNOSIS — Z96662 Presence of left artificial ankle joint: Secondary | ICD-10-CM | POA: Diagnosis not present

## 2018-05-01 DIAGNOSIS — Z602 Problems related to living alone: Secondary | ICD-10-CM | POA: Diagnosis not present

## 2018-05-01 DIAGNOSIS — Z471 Aftercare following joint replacement surgery: Secondary | ICD-10-CM | POA: Diagnosis not present

## 2018-05-01 DIAGNOSIS — Z96662 Presence of left artificial ankle joint: Secondary | ICD-10-CM | POA: Diagnosis not present

## 2018-05-01 DIAGNOSIS — M1991 Primary osteoarthritis, unspecified site: Secondary | ICD-10-CM | POA: Diagnosis not present

## 2018-05-01 DIAGNOSIS — I10 Essential (primary) hypertension: Secondary | ICD-10-CM | POA: Diagnosis not present

## 2018-05-01 DIAGNOSIS — G4733 Obstructive sleep apnea (adult) (pediatric): Secondary | ICD-10-CM | POA: Diagnosis not present

## 2018-05-01 DIAGNOSIS — M4800 Spinal stenosis, site unspecified: Secondary | ICD-10-CM | POA: Diagnosis not present

## 2018-05-01 DIAGNOSIS — I739 Peripheral vascular disease, unspecified: Secondary | ICD-10-CM | POA: Diagnosis not present

## 2018-05-01 DIAGNOSIS — I69354 Hemiplegia and hemiparesis following cerebral infarction affecting left non-dominant side: Secondary | ICD-10-CM | POA: Diagnosis not present

## 2018-05-02 DIAGNOSIS — I69354 Hemiplegia and hemiparesis following cerebral infarction affecting left non-dominant side: Secondary | ICD-10-CM | POA: Diagnosis not present

## 2018-05-02 DIAGNOSIS — M1991 Primary osteoarthritis, unspecified site: Secondary | ICD-10-CM | POA: Diagnosis not present

## 2018-05-02 DIAGNOSIS — I739 Peripheral vascular disease, unspecified: Secondary | ICD-10-CM | POA: Diagnosis not present

## 2018-05-02 DIAGNOSIS — Z471 Aftercare following joint replacement surgery: Secondary | ICD-10-CM | POA: Diagnosis not present

## 2018-05-02 DIAGNOSIS — M4800 Spinal stenosis, site unspecified: Secondary | ICD-10-CM | POA: Diagnosis not present

## 2018-05-02 DIAGNOSIS — Z96662 Presence of left artificial ankle joint: Secondary | ICD-10-CM | POA: Diagnosis not present

## 2018-05-02 DIAGNOSIS — Z602 Problems related to living alone: Secondary | ICD-10-CM | POA: Diagnosis not present

## 2018-05-02 DIAGNOSIS — G4733 Obstructive sleep apnea (adult) (pediatric): Secondary | ICD-10-CM | POA: Diagnosis not present

## 2018-05-02 DIAGNOSIS — I10 Essential (primary) hypertension: Secondary | ICD-10-CM | POA: Diagnosis not present

## 2018-05-03 DIAGNOSIS — Z471 Aftercare following joint replacement surgery: Secondary | ICD-10-CM | POA: Diagnosis not present

## 2018-05-03 DIAGNOSIS — Z96662 Presence of left artificial ankle joint: Secondary | ICD-10-CM | POA: Diagnosis not present

## 2018-05-15 DIAGNOSIS — I82432 Acute embolism and thrombosis of left popliteal vein: Secondary | ICD-10-CM | POA: Diagnosis not present

## 2018-05-15 DIAGNOSIS — E785 Hyperlipidemia, unspecified: Secondary | ICD-10-CM | POA: Diagnosis not present

## 2018-05-15 DIAGNOSIS — Z125 Encounter for screening for malignant neoplasm of prostate: Secondary | ICD-10-CM | POA: Diagnosis not present

## 2018-05-15 DIAGNOSIS — E119 Type 2 diabetes mellitus without complications: Secondary | ICD-10-CM | POA: Diagnosis not present

## 2018-05-15 DIAGNOSIS — I10 Essential (primary) hypertension: Secondary | ICD-10-CM | POA: Diagnosis not present

## 2018-05-15 DIAGNOSIS — Z1339 Encounter for screening examination for other mental health and behavioral disorders: Secondary | ICD-10-CM | POA: Diagnosis not present

## 2018-05-29 DIAGNOSIS — R600 Localized edema: Secondary | ICD-10-CM | POA: Diagnosis not present

## 2018-05-29 DIAGNOSIS — Z95828 Presence of other vascular implants and grafts: Secondary | ICD-10-CM | POA: Diagnosis not present

## 2018-05-29 DIAGNOSIS — I82432 Acute embolism and thrombosis of left popliteal vein: Secondary | ICD-10-CM | POA: Diagnosis not present

## 2018-05-29 DIAGNOSIS — Z96662 Presence of left artificial ankle joint: Secondary | ICD-10-CM | POA: Diagnosis not present

## 2018-05-29 DIAGNOSIS — L03116 Cellulitis of left lower limb: Secondary | ICD-10-CM | POA: Diagnosis not present

## 2018-05-30 DIAGNOSIS — R600 Localized edema: Secondary | ICD-10-CM | POA: Diagnosis not present

## 2018-05-30 DIAGNOSIS — M7989 Other specified soft tissue disorders: Secondary | ICD-10-CM | POA: Diagnosis not present

## 2018-06-14 DIAGNOSIS — R2242 Localized swelling, mass and lump, left lower limb: Secondary | ICD-10-CM | POA: Diagnosis not present

## 2018-06-14 DIAGNOSIS — Z96669 Presence of unspecified artificial ankle joint: Secondary | ICD-10-CM | POA: Diagnosis not present

## 2018-06-14 DIAGNOSIS — M25572 Pain in left ankle and joints of left foot: Secondary | ICD-10-CM | POA: Diagnosis not present

## 2018-07-05 DIAGNOSIS — H401230 Low-tension glaucoma, bilateral, stage unspecified: Secondary | ICD-10-CM | POA: Diagnosis not present

## 2018-08-01 ENCOUNTER — Other Ambulatory Visit: Payer: Self-pay

## 2018-08-01 NOTE — Patient Outreach (Signed)
[facility] Care Management  08/01/2018  [name] 12-06-1932 [id number]   Medication Adherence call to Mr. [name] spoke with patient he said he pick up both medication on 07/23/18 just a 30 days supply on Atorvastatin 10 mg and Losartan 100 mg from [facility] patient does not want a 90 days supply. Mr. Kassebaum is showing past due under [place].  [place] Management Direct Dial [phone]  Fax [phone] [email or web address]

## 2018-08-25 DIAGNOSIS — M25572 Pain in left ankle and joints of left foot: Secondary | ICD-10-CM | POA: Diagnosis not present

## 2018-08-25 DIAGNOSIS — R2242 Localized swelling, mass and lump, left lower limb: Secondary | ICD-10-CM | POA: Diagnosis not present

## 2018-08-25 DIAGNOSIS — Z96669 Presence of unspecified artificial ankle joint: Secondary | ICD-10-CM | POA: Diagnosis not present

## 2018-08-25 DIAGNOSIS — M25571 Pain in right ankle and joints of right foot: Secondary | ICD-10-CM | POA: Diagnosis not present

## 2018-09-18 DIAGNOSIS — K5909 Other constipation: Secondary | ICD-10-CM | POA: Diagnosis not present

## 2018-10-28 IMAGING — CR DG MYELOGRAPHY LUMBAR INJ LUMBOSACRAL
4 series · 4 of 4 positions shown · non-contrast
Comparison: MRI lumbar spine most recent 04/11/2017. Also MRI
lumbar spine 03/10/2016.

CLINICAL DATA: Low back pain. Chronic RIGHT leg pain. Previous
lumbar surgery.
TECHNIQUE: Contiguous axial images were obtained through the Lumbar spine after
the intrathecal infusion of infusion. Coronal and sagittal
reconstructions were obtained of the axial image sets.

[w lumbar spine ap]
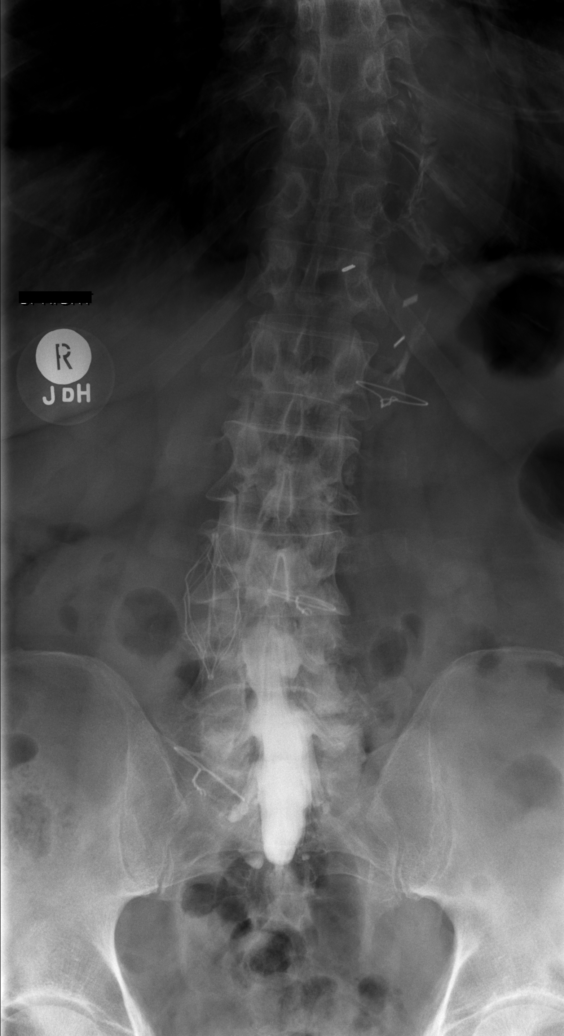

[w lumbar spine lat]
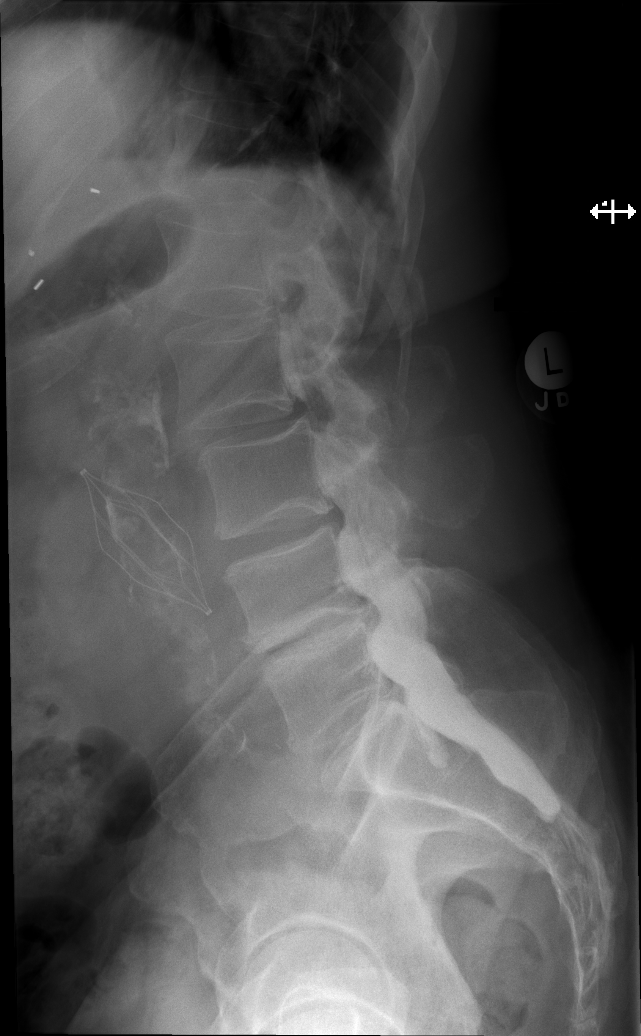

[w lumbar spine flexion]
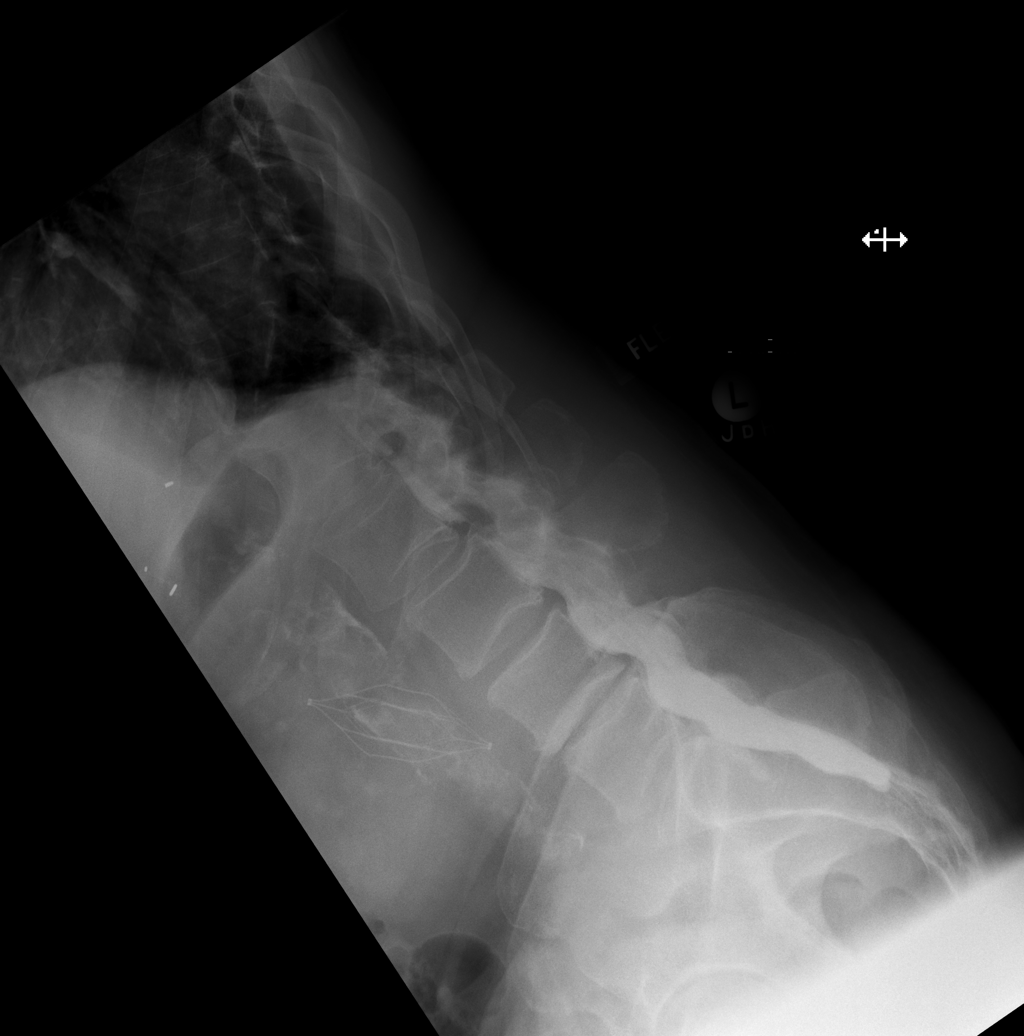

[w lumbar spine extension]
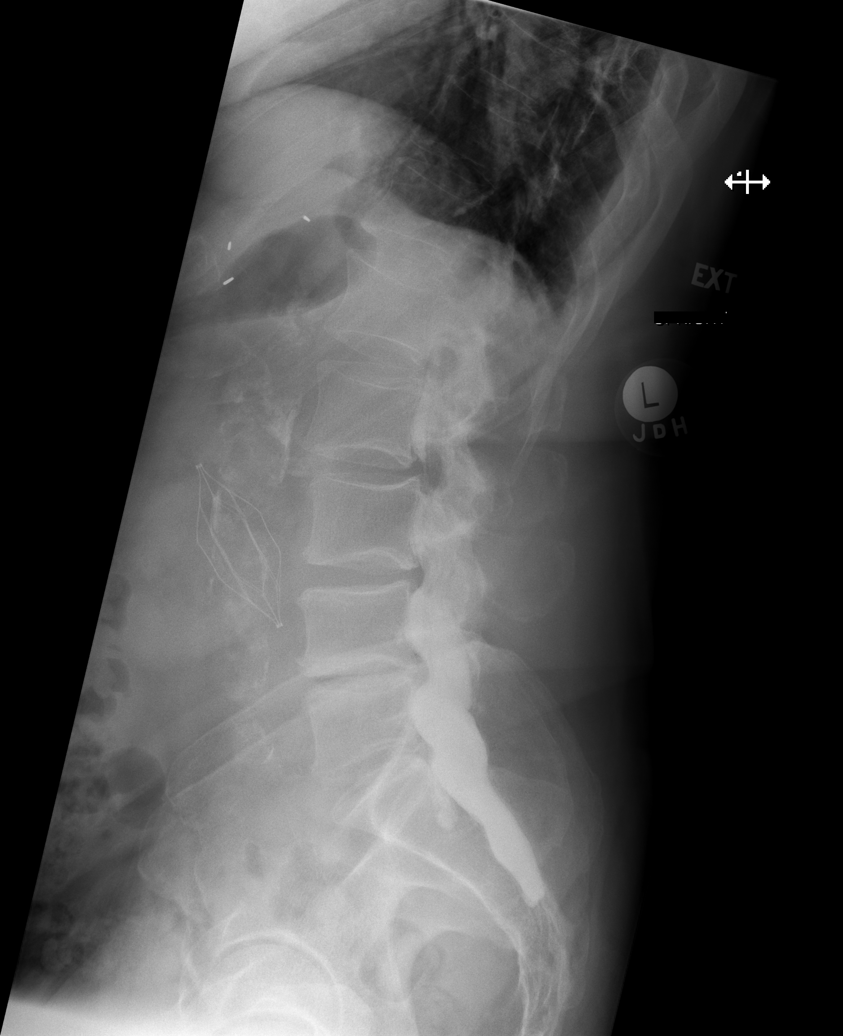

[4 of 4 positions shown; findings below may reference images not displayed]

EXAM:
LUMBAR MYELOGRAM

FLUOROSCOPY TIME:  28 seconds corresponding to a Dose Area Product
of 291 ?Gy*m2

PROCEDURE:
After thorough discussion of risks and benefits of the procedure
including bleeding, infection, injury to nerves, blood vessels,
adjacent structures as well as headache and CSF leak, written and
oral informed consent was obtained. Consent was obtained by Dr. Bercovich
Tiger. Time out form was completed.

Patient was positioned prone on the fluoroscopy table. Local
anesthesia was provided with 1% lidocaine without epinephrine after
prepped and draped in the usual sterile fashion. Puncture was
performed at L5 using a 3 1/2 inch 22-gauge spinal needle via
midline approach. Using a single pass through the dura, the needle
was placed within the thecal sac, with return of clear CSF. 15 mL of
Isovue 9-NAA was injected into the thecal sac, with normal
opacification of the nerve roots and cauda equina consistent with
free flow within the subarachnoid space.

I personally performed the lumbar puncture and administered the
intrathecal contrast. I also personally supervised acquisition of
the myelogram images.
FINDINGS: LUMBAR MYELOGRAM FINDINGS:

Good opacification lumbar subarachnoid space. Advanced disc space
narrowing L4-5. Central protrusion at L4-5 extends more to the LEFT
than RIGHT. BILATERAL L5 nerve root impingement is observed. Central
protrusion at L3-4 also noted. BILATERAL L4 nerve root effacement.

Mild to moderate stenosis L2-L3. RIGHT greater than LEFT L3 nerve
root effacement. CT myelogram image 22.

Upright radiographs demonstrate anatomic alignment except at L2-3
where there is 3 mm anterolisthesis in neutral. In upright flexion
this increases to 5 mm. In extension, 3 mm anterolisthesis is
maintained.

CT LUMBAR MYELOGRAM FINDINGS:

Segmentation: Normal.

Alignment: 2 mm anterolisthesis at L2-3 with the patient recumbent
for CT. Otherwise anatomic.

Vertebrae: No worrisome osseous lesion.

Conus medullaris: Normal in size and location, terminating at L1.

Paraspinal tissues: No evidence for hydronephrosis or paravertebral
mass. Aortic atherosclerosis, and ectasia, transverse dimension of
24 mm. IVC filter appears in appropriate location.

Disc levels:

L1-L2: Moderate facet probably, greater on the RIGHT. No disc
protrusion, stenosis, or subarticular zone/foraminal zone narrowing.

L2-L3: Annular bulge. 2 mm slip. Moderate stenosis, with patient
recumbent. This stenosis is likely to be severe with the patient
standing flexion. Posterior element hypertrophy with RIGHT greater
than LEFT ligamentum flavum hypertrophy. Small RIGHT-sided synovial
cyst better seen on MRI. RIGHT greater than LEFT L2 and L3 nerve
root impingement.

L3-L4: Central and leftward protrusion. Facet arthropathy. No
significant stenosis. LEFT greater than RIGHT L4 nerve root
impingement.

L4-L5: Central extrusion. Laminectomy. Facet arthropathy. BILATERAL
subarticular zone and foraminal zone narrowing could affect the L4
and L5 nerve roots.

L5-S1:  Annular bulge.  No impingement.
IMPRESSION: LUMBAR MYELOGRAM IMPRESSION:

Postsurgical change status post L4 and L5 laminectomy. Anatomic
alignment at the surgical levels.

Mild to moderate stenosis at L2-3. RIGHT greater than LEFT L3 nerve
root impingement.

Dynamic instability at L2-3. This ranges from no significant slip
with patient prone, to 5 mm anterolisthesis in standing flexion.

CT LUMBAR MYELOGRAM IMPRESSION:

Moderate stenosis at L2-3 is multifactorial, related to posterior
element hypertrophy, mild slip, and annular bulge. RIGHT greater
than LEFT L2 and L3 nerve root impingement is suspected.

Central extrusion at L4-5 with wide laminectomy. Facet arthropathy.
BILATERAL subarticular zone and foraminal zone narrowing could
affect the BILATERAL L4 and L5 nerve roots.

Central and leftward protrusion at L3-4. LEFT greater than RIGHT L4
nerve root impingement.

Aortic Atherosclerosis ([id number]-T85.5).

## 2018-10-30 DIAGNOSIS — H6123 Impacted cerumen, bilateral: Secondary | ICD-10-CM | POA: Diagnosis not present

## 2018-11-20 DIAGNOSIS — E785 Hyperlipidemia, unspecified: Secondary | ICD-10-CM | POA: Diagnosis not present

## 2018-11-20 DIAGNOSIS — E119 Type 2 diabetes mellitus without complications: Secondary | ICD-10-CM | POA: Diagnosis not present

## 2018-11-20 DIAGNOSIS — I10 Essential (primary) hypertension: Secondary | ICD-10-CM | POA: Diagnosis not present

## 2018-11-20 DIAGNOSIS — I82432 Acute embolism and thrombosis of left popliteal vein: Secondary | ICD-10-CM | POA: Diagnosis not present

## 2018-11-20 DIAGNOSIS — Z23 Encounter for immunization: Secondary | ICD-10-CM | POA: Diagnosis not present

## 2018-12-21 ENCOUNTER — Other Ambulatory Visit: Payer: Self-pay

## 2018-12-21 NOTE — Patient Outreach (Signed)
[facility]) Care Management  12/21/2018  [name] 1932-12-23 [id number]   Medication adherence call to Mr. Cheryl Stabenow spoke with patient he is due on Atorvastatin 10 mg and Losartan 100 mg patient picks up all his medication on the 1st of the month he said [facility] on ready fill and will switch to a 90 days supply. Mr. Fellers is showing past due under [place].   [place] Management Direct Dial [phone]  Fax [phone] [email or web address]

## 2018-12-26 DIAGNOSIS — Z136 Encounter for screening for cardiovascular disorders: Secondary | ICD-10-CM | POA: Diagnosis not present

## 2018-12-26 DIAGNOSIS — E785 Hyperlipidemia, unspecified: Secondary | ICD-10-CM | POA: Diagnosis not present

## 2018-12-26 DIAGNOSIS — Z0000 Encounter for general adult medical examination without abnormal findings: Secondary | ICD-10-CM | POA: Diagnosis not present

## 2018-12-26 DIAGNOSIS — Z9181 History of falling: Secondary | ICD-10-CM | POA: Diagnosis not present

## 2019-04-04 DIAGNOSIS — H04123 Dry eye syndrome of bilateral lacrimal glands: Secondary | ICD-10-CM | POA: Diagnosis not present

## 2019-04-04 DIAGNOSIS — H524 Presbyopia: Secondary | ICD-10-CM | POA: Diagnosis not present

## 2019-04-13 DIAGNOSIS — M2042 Other hammer toe(s) (acquired), left foot: Secondary | ICD-10-CM | POA: Diagnosis not present

## 2019-04-13 DIAGNOSIS — M25572 Pain in left ankle and joints of left foot: Secondary | ICD-10-CM | POA: Diagnosis not present

## 2019-04-13 DIAGNOSIS — M19079 Primary osteoarthritis, unspecified ankle and foot: Secondary | ICD-10-CM | POA: Diagnosis not present

## 2019-05-21 DIAGNOSIS — E785 Hyperlipidemia, unspecified: Secondary | ICD-10-CM | POA: Diagnosis not present

## 2019-05-21 DIAGNOSIS — I82532 Chronic embolism and thrombosis of left popliteal vein: Secondary | ICD-10-CM | POA: Diagnosis not present

## 2019-05-21 DIAGNOSIS — I10 Essential (primary) hypertension: Secondary | ICD-10-CM | POA: Diagnosis not present

## 2019-05-21 DIAGNOSIS — Z139 Encounter for screening, unspecified: Secondary | ICD-10-CM | POA: Diagnosis not present

## 2019-05-21 DIAGNOSIS — E1169 Type 2 diabetes mellitus with other specified complication: Secondary | ICD-10-CM | POA: Diagnosis not present

## 2019-05-23 DIAGNOSIS — K4090 Unilateral inguinal hernia, without obstruction or gangrene, not specified as recurrent: Secondary | ICD-10-CM | POA: Diagnosis not present

## 2019-05-23 DIAGNOSIS — K429 Umbilical hernia without obstruction or gangrene: Secondary | ICD-10-CM | POA: Diagnosis not present

## 2019-06-14 DIAGNOSIS — E119 Type 2 diabetes mellitus without complications: Secondary | ICD-10-CM | POA: Diagnosis not present

## 2019-06-14 DIAGNOSIS — K4090 Unilateral inguinal hernia, without obstruction or gangrene, not specified as recurrent: Secondary | ICD-10-CM | POA: Diagnosis not present

## 2019-06-14 DIAGNOSIS — Z79899 Other long term (current) drug therapy: Secondary | ICD-10-CM | POA: Diagnosis not present

## 2019-06-14 DIAGNOSIS — G629 Polyneuropathy, unspecified: Secondary | ICD-10-CM | POA: Diagnosis not present

## 2019-06-14 DIAGNOSIS — K429 Umbilical hernia without obstruction or gangrene: Secondary | ICD-10-CM | POA: Diagnosis not present

## 2019-06-14 DIAGNOSIS — Z8673 Personal history of transient ischemic attack (TIA), and cerebral infarction without residual deficits: Secondary | ICD-10-CM | POA: Diagnosis not present

## 2019-06-14 DIAGNOSIS — I10 Essential (primary) hypertension: Secondary | ICD-10-CM | POA: Diagnosis not present

## 2019-06-14 DIAGNOSIS — H3530 Unspecified macular degeneration: Secondary | ICD-10-CM | POA: Diagnosis not present

## 2019-06-14 DIAGNOSIS — Z87891 Personal history of nicotine dependence: Secondary | ICD-10-CM | POA: Diagnosis not present

## 2019-06-14 DIAGNOSIS — G4733 Obstructive sleep apnea (adult) (pediatric): Secondary | ICD-10-CM | POA: Diagnosis not present

## 2019-06-14 DIAGNOSIS — K279 Peptic ulcer, site unspecified, unspecified as acute or chronic, without hemorrhage or perforation: Secondary | ICD-10-CM | POA: Diagnosis not present

## 2019-06-14 DIAGNOSIS — E7800 Pure hypercholesterolemia, unspecified: Secondary | ICD-10-CM | POA: Diagnosis not present

## 2019-06-21 DIAGNOSIS — Z09 Encounter for follow-up examination after completed treatment for conditions other than malignant neoplasm: Secondary | ICD-10-CM | POA: Diagnosis not present

## 2019-07-02 DIAGNOSIS — R5383 Other fatigue: Secondary | ICD-10-CM | POA: Diagnosis not present

## 2019-07-12 DIAGNOSIS — M545 Low back pain: Secondary | ICD-10-CM | POA: Diagnosis not present

## 2019-07-12 DIAGNOSIS — M5416 Radiculopathy, lumbar region: Secondary | ICD-10-CM | POA: Diagnosis not present

## 2019-07-12 DIAGNOSIS — M533 Sacrococcygeal disorders, not elsewhere classified: Secondary | ICD-10-CM | POA: Diagnosis not present

## 2019-07-17 DIAGNOSIS — M25552 Pain in left hip: Secondary | ICD-10-CM | POA: Diagnosis not present

## 2019-07-17 DIAGNOSIS — M25551 Pain in right hip: Secondary | ICD-10-CM | POA: Diagnosis not present

## 2019-07-17 DIAGNOSIS — M545 Low back pain: Secondary | ICD-10-CM | POA: Diagnosis not present

## 2019-07-19 DIAGNOSIS — M545 Low back pain: Secondary | ICD-10-CM | POA: Diagnosis not present

## 2019-07-19 DIAGNOSIS — M25552 Pain in left hip: Secondary | ICD-10-CM | POA: Diagnosis not present

## 2019-07-19 DIAGNOSIS — M25551 Pain in right hip: Secondary | ICD-10-CM | POA: Diagnosis not present

## 2019-07-23 DIAGNOSIS — M545 Low back pain: Secondary | ICD-10-CM | POA: Diagnosis not present

## 2019-07-23 DIAGNOSIS — M25551 Pain in right hip: Secondary | ICD-10-CM | POA: Diagnosis not present

## 2019-07-23 DIAGNOSIS — M25552 Pain in left hip: Secondary | ICD-10-CM | POA: Diagnosis not present

## 2019-08-08 DIAGNOSIS — R1031 Right lower quadrant pain: Secondary | ICD-10-CM | POA: Diagnosis not present

## 2019-08-27 DIAGNOSIS — R1031 Right lower quadrant pain: Secondary | ICD-10-CM | POA: Diagnosis not present

## 2019-08-27 DIAGNOSIS — R42 Dizziness and giddiness: Secondary | ICD-10-CM | POA: Diagnosis not present

## 2019-08-27 DIAGNOSIS — H9191 Unspecified hearing loss, right ear: Secondary | ICD-10-CM | POA: Diagnosis not present

## 2019-08-27 DIAGNOSIS — Z23 Encounter for immunization: Secondary | ICD-10-CM | POA: Diagnosis not present

## 2019-08-27 DIAGNOSIS — R1032 Left lower quadrant pain: Secondary | ICD-10-CM | POA: Diagnosis not present

## 2019-09-07 DIAGNOSIS — H9311 Tinnitus, right ear: Secondary | ICD-10-CM | POA: Diagnosis not present

## 2019-09-07 DIAGNOSIS — R42 Dizziness and giddiness: Secondary | ICD-10-CM | POA: Diagnosis not present

## 2019-09-07 DIAGNOSIS — H9191 Unspecified hearing loss, right ear: Secondary | ICD-10-CM | POA: Diagnosis not present

## 2019-09-07 DIAGNOSIS — R269 Unspecified abnormalities of gait and mobility: Secondary | ICD-10-CM | POA: Diagnosis not present

## 2019-09-07 DIAGNOSIS — H61303 Acquired stenosis of external ear canal, unspecified, bilateral: Secondary | ICD-10-CM | POA: Diagnosis not present

## 2019-10-01 DIAGNOSIS — H903 Sensorineural hearing loss, bilateral: Secondary | ICD-10-CM | POA: Diagnosis not present

## 2019-10-01 DIAGNOSIS — H9319 Tinnitus, unspecified ear: Secondary | ICD-10-CM | POA: Diagnosis not present

## 2019-10-01 DIAGNOSIS — H93299 Other abnormal auditory perceptions, unspecified ear: Secondary | ICD-10-CM | POA: Diagnosis not present

## 2019-10-15 DIAGNOSIS — M7989 Other specified soft tissue disorders: Secondary | ICD-10-CM | POA: Diagnosis not present

## 2019-10-15 DIAGNOSIS — M1612 Unilateral primary osteoarthritis, left hip: Secondary | ICD-10-CM | POA: Diagnosis not present

## 2019-10-15 DIAGNOSIS — M25552 Pain in left hip: Secondary | ICD-10-CM | POA: Diagnosis not present

## 2019-10-15 DIAGNOSIS — I82532 Chronic embolism and thrombosis of left popliteal vein: Secondary | ICD-10-CM | POA: Diagnosis not present

## 2019-11-20 DIAGNOSIS — M545 Low back pain: Secondary | ICD-10-CM | POA: Diagnosis not present

## 2019-11-20 DIAGNOSIS — I82532 Chronic embolism and thrombosis of left popliteal vein: Secondary | ICD-10-CM | POA: Diagnosis not present

## 2019-11-20 DIAGNOSIS — E785 Hyperlipidemia, unspecified: Secondary | ICD-10-CM | POA: Diagnosis not present

## 2019-11-20 DIAGNOSIS — I10 Essential (primary) hypertension: Secondary | ICD-10-CM | POA: Diagnosis not present

## 2019-11-20 DIAGNOSIS — E1169 Type 2 diabetes mellitus with other specified complication: Secondary | ICD-10-CM | POA: Diagnosis not present

## 2019-12-16 DIAGNOSIS — R112 Nausea with vomiting, unspecified: Secondary | ICD-10-CM | POA: Diagnosis not present

## 2019-12-16 DIAGNOSIS — Z743 Need for continuous supervision: Secondary | ICD-10-CM | POA: Diagnosis not present

## 2019-12-16 DIAGNOSIS — R5381 Other malaise: Secondary | ICD-10-CM | POA: Diagnosis not present

## 2019-12-16 DIAGNOSIS — I10 Essential (primary) hypertension: Secondary | ICD-10-CM | POA: Diagnosis not present

## 2019-12-16 DIAGNOSIS — R42 Dizziness and giddiness: Secondary | ICD-10-CM | POA: Diagnosis not present

## 2019-12-20 DIAGNOSIS — H6123 Impacted cerumen, bilateral: Secondary | ICD-10-CM | POA: Diagnosis not present

## 2019-12-20 DIAGNOSIS — Z87898 Personal history of other specified conditions: Secondary | ICD-10-CM | POA: Diagnosis not present

## 2019-12-20 DIAGNOSIS — H61303 Acquired stenosis of external ear canal, unspecified, bilateral: Secondary | ICD-10-CM | POA: Diagnosis not present

## 2019-12-21 DIAGNOSIS — H8113 Benign paroxysmal vertigo, bilateral: Secondary | ICD-10-CM | POA: Diagnosis not present

## 2019-12-26 DIAGNOSIS — R42 Dizziness and giddiness: Secondary | ICD-10-CM | POA: Diagnosis not present

## 2019-12-26 DIAGNOSIS — H8111 Benign paroxysmal vertigo, right ear: Secondary | ICD-10-CM | POA: Diagnosis not present

## 2019-12-31 DIAGNOSIS — E785 Hyperlipidemia, unspecified: Secondary | ICD-10-CM | POA: Diagnosis not present

## 2019-12-31 DIAGNOSIS — Z0000 Encounter for general adult medical examination without abnormal findings: Secondary | ICD-10-CM | POA: Diagnosis not present

## 2019-12-31 DIAGNOSIS — Z9181 History of falling: Secondary | ICD-10-CM | POA: Diagnosis not present

## 2020-01-01 DIAGNOSIS — M533 Sacrococcygeal disorders, not elsewhere classified: Secondary | ICD-10-CM | POA: Diagnosis not present

## 2020-01-01 DIAGNOSIS — M5416 Radiculopathy, lumbar region: Secondary | ICD-10-CM | POA: Diagnosis not present

## 2020-01-24 DIAGNOSIS — M5416 Radiculopathy, lumbar region: Secondary | ICD-10-CM | POA: Diagnosis not present

## 2020-01-24 DIAGNOSIS — M1612 Unilateral primary osteoarthritis, left hip: Secondary | ICD-10-CM | POA: Diagnosis not present

## 2020-01-24 DIAGNOSIS — M545 Low back pain: Secondary | ICD-10-CM | POA: Diagnosis not present

## 2020-02-01 DIAGNOSIS — M5416 Radiculopathy, lumbar region: Secondary | ICD-10-CM | POA: Diagnosis not present

## 2020-02-01 DIAGNOSIS — M533 Sacrococcygeal disorders, not elsewhere classified: Secondary | ICD-10-CM | POA: Diagnosis not present

## 2020-04-16 DIAGNOSIS — H168 Other keratitis: Secondary | ICD-10-CM | POA: Diagnosis not present

## 2020-04-16 DIAGNOSIS — H353131 Nonexudative age-related macular degeneration, bilateral, early dry stage: Secondary | ICD-10-CM | POA: Diagnosis not present

## 2020-04-30 DIAGNOSIS — M2042 Other hammer toe(s) (acquired), left foot: Secondary | ICD-10-CM | POA: Diagnosis not present

## 2020-04-30 DIAGNOSIS — M79672 Pain in left foot: Secondary | ICD-10-CM | POA: Diagnosis not present

## 2020-04-30 DIAGNOSIS — Z96669 Presence of unspecified artificial ankle joint: Secondary | ICD-10-CM | POA: Diagnosis not present

## 2020-04-30 DIAGNOSIS — M7742 Metatarsalgia, left foot: Secondary | ICD-10-CM | POA: Diagnosis not present

## 2020-04-30 DIAGNOSIS — M25572 Pain in left ankle and joints of left foot: Secondary | ICD-10-CM | POA: Diagnosis not present

## 2020-05-19 DIAGNOSIS — E1169 Type 2 diabetes mellitus with other specified complication: Secondary | ICD-10-CM | POA: Diagnosis not present

## 2020-05-19 DIAGNOSIS — R0600 Dyspnea, unspecified: Secondary | ICD-10-CM | POA: Diagnosis not present

## 2020-05-19 DIAGNOSIS — Z86718 Personal history of other venous thrombosis and embolism: Secondary | ICD-10-CM | POA: Diagnosis not present

## 2020-05-19 DIAGNOSIS — E785 Hyperlipidemia, unspecified: Secondary | ICD-10-CM | POA: Diagnosis not present

## 2020-05-19 DIAGNOSIS — I10 Essential (primary) hypertension: Secondary | ICD-10-CM | POA: Diagnosis not present

## 2020-06-19 DIAGNOSIS — H6123 Impacted cerumen, bilateral: Secondary | ICD-10-CM | POA: Diagnosis not present

## 2020-06-19 DIAGNOSIS — H61303 Acquired stenosis of external ear canal, unspecified, bilateral: Secondary | ICD-10-CM | POA: Diagnosis not present

## 2020-07-14 DIAGNOSIS — B029 Zoster without complications: Secondary | ICD-10-CM | POA: Diagnosis not present

## 2020-07-14 DIAGNOSIS — R1032 Left lower quadrant pain: Secondary | ICD-10-CM | POA: Diagnosis not present

## 2020-07-14 DIAGNOSIS — G8929 Other chronic pain: Secondary | ICD-10-CM | POA: Diagnosis not present

## 2020-07-17 DIAGNOSIS — R1032 Left lower quadrant pain: Secondary | ICD-10-CM | POA: Diagnosis not present

## 2020-07-17 DIAGNOSIS — N200 Calculus of kidney: Secondary | ICD-10-CM | POA: Diagnosis not present

## 2020-07-17 DIAGNOSIS — K4090 Unilateral inguinal hernia, without obstruction or gangrene, not specified as recurrent: Secondary | ICD-10-CM | POA: Diagnosis not present

## 2020-07-17 DIAGNOSIS — I700 Atherosclerosis of aorta: Secondary | ICD-10-CM | POA: Diagnosis not present

## 2020-07-17 DIAGNOSIS — K5730 Diverticulosis of large intestine without perforation or abscess without bleeding: Secondary | ICD-10-CM | POA: Diagnosis not present

## 2020-07-18 DIAGNOSIS — M5136 Other intervertebral disc degeneration, lumbar region: Secondary | ICD-10-CM | POA: Diagnosis not present

## 2020-07-18 DIAGNOSIS — M5416 Radiculopathy, lumbar region: Secondary | ICD-10-CM | POA: Diagnosis not present

## 2020-07-18 DIAGNOSIS — M533 Sacrococcygeal disorders, not elsewhere classified: Secondary | ICD-10-CM | POA: Diagnosis not present

## 2020-08-28 DIAGNOSIS — Z23 Encounter for immunization: Secondary | ICD-10-CM | POA: Diagnosis not present

## 2020-08-28 DIAGNOSIS — R1032 Left lower quadrant pain: Secondary | ICD-10-CM | POA: Diagnosis not present

## 2020-08-28 DIAGNOSIS — R1031 Right lower quadrant pain: Secondary | ICD-10-CM | POA: Diagnosis not present

## 2020-11-19 DIAGNOSIS — R21 Rash and other nonspecific skin eruption: Secondary | ICD-10-CM | POA: Diagnosis not present

## 2020-11-19 DIAGNOSIS — E1169 Type 2 diabetes mellitus with other specified complication: Secondary | ICD-10-CM | POA: Diagnosis not present

## 2020-11-19 DIAGNOSIS — Z86718 Personal history of other venous thrombosis and embolism: Secondary | ICD-10-CM | POA: Diagnosis not present

## 2020-11-19 DIAGNOSIS — E785 Hyperlipidemia, unspecified: Secondary | ICD-10-CM | POA: Diagnosis not present

## 2020-11-19 DIAGNOSIS — I10 Essential (primary) hypertension: Secondary | ICD-10-CM | POA: Diagnosis not present

## 2020-12-18 DIAGNOSIS — M5416 Radiculopathy, lumbar region: Secondary | ICD-10-CM | POA: Diagnosis not present

## 2020-12-26 DIAGNOSIS — M47817 Spondylosis without myelopathy or radiculopathy, lumbosacral region: Secondary | ICD-10-CM | POA: Diagnosis not present

## 2020-12-26 DIAGNOSIS — M5416 Radiculopathy, lumbar region: Secondary | ICD-10-CM | POA: Diagnosis not present

## 2020-12-31 DIAGNOSIS — Z139 Encounter for screening, unspecified: Secondary | ICD-10-CM | POA: Diagnosis not present

## 2020-12-31 DIAGNOSIS — E785 Hyperlipidemia, unspecified: Secondary | ICD-10-CM | POA: Diagnosis not present

## 2020-12-31 DIAGNOSIS — Z0000 Encounter for general adult medical examination without abnormal findings: Secondary | ICD-10-CM | POA: Diagnosis not present

## 2020-12-31 DIAGNOSIS — Z9181 History of falling: Secondary | ICD-10-CM | POA: Diagnosis not present

## 2021-01-01 DIAGNOSIS — H61303 Acquired stenosis of external ear canal, unspecified, bilateral: Secondary | ICD-10-CM | POA: Diagnosis not present

## 2021-01-01 DIAGNOSIS — H6123 Impacted cerumen, bilateral: Secondary | ICD-10-CM | POA: Diagnosis not present

## 2021-01-01 DIAGNOSIS — J300 Vasomotor rhinitis: Secondary | ICD-10-CM | POA: Diagnosis not present

## 2021-01-23 DIAGNOSIS — M2042 Other hammer toe(s) (acquired), left foot: Secondary | ICD-10-CM | POA: Diagnosis not present

## 2021-01-23 DIAGNOSIS — Z96662 Presence of left artificial ankle joint: Secondary | ICD-10-CM | POA: Diagnosis not present

## 2021-01-29 DIAGNOSIS — M5416 Radiculopathy, lumbar region: Secondary | ICD-10-CM | POA: Diagnosis not present

## 2021-02-03 DIAGNOSIS — I82532 Chronic embolism and thrombosis of left popliteal vein: Secondary | ICD-10-CM | POA: Diagnosis not present

## 2021-02-03 DIAGNOSIS — E1169 Type 2 diabetes mellitus with other specified complication: Secondary | ICD-10-CM | POA: Diagnosis not present

## 2021-02-03 DIAGNOSIS — I10 Essential (primary) hypertension: Secondary | ICD-10-CM | POA: Diagnosis not present

## 2021-02-03 DIAGNOSIS — M5416 Radiculopathy, lumbar region: Secondary | ICD-10-CM | POA: Diagnosis not present

## 2021-02-03 DIAGNOSIS — E785 Hyperlipidemia, unspecified: Secondary | ICD-10-CM | POA: Diagnosis not present

## 2021-02-04 DIAGNOSIS — E559 Vitamin D deficiency, unspecified: Secondary | ICD-10-CM | POA: Diagnosis not present

## 2021-02-04 DIAGNOSIS — Z79899 Other long term (current) drug therapy: Secondary | ICD-10-CM | POA: Diagnosis not present

## 2021-02-04 DIAGNOSIS — M79609 Pain in unspecified limb: Secondary | ICD-10-CM | POA: Diagnosis not present

## 2021-02-04 DIAGNOSIS — Z01818 Encounter for other preprocedural examination: Secondary | ICD-10-CM | POA: Diagnosis not present

## 2021-03-04 DIAGNOSIS — Z95828 Presence of other vascular implants and grafts: Secondary | ICD-10-CM | POA: Diagnosis not present

## 2021-03-04 DIAGNOSIS — E1169 Type 2 diabetes mellitus with other specified complication: Secondary | ICD-10-CM | POA: Diagnosis not present

## 2021-03-04 DIAGNOSIS — I10 Essential (primary) hypertension: Secondary | ICD-10-CM | POA: Diagnosis not present

## 2021-03-04 DIAGNOSIS — M479 Spondylosis, unspecified: Secondary | ICD-10-CM | POA: Diagnosis not present

## 2021-03-04 DIAGNOSIS — Z8673 Personal history of transient ischemic attack (TIA), and cerebral infarction without residual deficits: Secondary | ICD-10-CM | POA: Diagnosis not present

## 2021-03-04 DIAGNOSIS — M48062 Spinal stenosis, lumbar region with neurogenic claudication: Secondary | ICD-10-CM | POA: Diagnosis not present

## 2021-03-04 DIAGNOSIS — G4733 Obstructive sleep apnea (adult) (pediatric): Secondary | ICD-10-CM | POA: Diagnosis not present

## 2021-03-04 DIAGNOSIS — K279 Peptic ulcer, site unspecified, unspecified as acute or chronic, without hemorrhage or perforation: Secondary | ICD-10-CM | POA: Diagnosis not present

## 2021-03-04 DIAGNOSIS — M48061 Spinal stenosis, lumbar region without neurogenic claudication: Secondary | ICD-10-CM | POA: Diagnosis not present

## 2021-03-04 DIAGNOSIS — E785 Hyperlipidemia, unspecified: Secondary | ICD-10-CM | POA: Diagnosis not present

## 2021-03-04 DIAGNOSIS — D649 Anemia, unspecified: Secondary | ICD-10-CM | POA: Diagnosis not present

## 2021-03-04 DIAGNOSIS — Z79899 Other long term (current) drug therapy: Secondary | ICD-10-CM | POA: Diagnosis not present

## 2021-03-04 DIAGNOSIS — K589 Irritable bowel syndrome without diarrhea: Secondary | ICD-10-CM | POA: Diagnosis not present

## 2021-03-04 DIAGNOSIS — M5136 Other intervertebral disc degeneration, lumbar region: Secondary | ICD-10-CM | POA: Diagnosis not present

## 2021-03-04 DIAGNOSIS — Z7982 Long term (current) use of aspirin: Secondary | ICD-10-CM | POA: Diagnosis not present

## 2021-03-04 DIAGNOSIS — Z86718 Personal history of other venous thrombosis and embolism: Secondary | ICD-10-CM | POA: Diagnosis not present

## 2021-03-04 DIAGNOSIS — M5116 Intervertebral disc disorders with radiculopathy, lumbar region: Secondary | ICD-10-CM | POA: Diagnosis not present

## 2021-03-04 DIAGNOSIS — H3530 Unspecified macular degeneration: Secondary | ICD-10-CM | POA: Diagnosis not present

## 2021-03-04 DIAGNOSIS — M5416 Radiculopathy, lumbar region: Secondary | ICD-10-CM | POA: Diagnosis not present

## 2021-03-05 DIAGNOSIS — K279 Peptic ulcer, site unspecified, unspecified as acute or chronic, without hemorrhage or perforation: Secondary | ICD-10-CM | POA: Diagnosis not present

## 2021-03-05 DIAGNOSIS — Z8673 Personal history of transient ischemic attack (TIA), and cerebral infarction without residual deficits: Secondary | ICD-10-CM | POA: Diagnosis not present

## 2021-03-05 DIAGNOSIS — H3530 Unspecified macular degeneration: Secondary | ICD-10-CM | POA: Diagnosis not present

## 2021-03-05 DIAGNOSIS — I10 Essential (primary) hypertension: Secondary | ICD-10-CM | POA: Diagnosis not present

## 2021-03-05 DIAGNOSIS — M5116 Intervertebral disc disorders with radiculopathy, lumbar region: Secondary | ICD-10-CM | POA: Diagnosis not present

## 2021-03-05 DIAGNOSIS — E785 Hyperlipidemia, unspecified: Secondary | ICD-10-CM | POA: Diagnosis not present

## 2021-03-05 DIAGNOSIS — Z86718 Personal history of other venous thrombosis and embolism: Secondary | ICD-10-CM | POA: Diagnosis not present

## 2021-03-05 DIAGNOSIS — D649 Anemia, unspecified: Secondary | ICD-10-CM | POA: Diagnosis not present

## 2021-03-05 DIAGNOSIS — Z7982 Long term (current) use of aspirin: Secondary | ICD-10-CM | POA: Diagnosis not present

## 2021-03-05 DIAGNOSIS — K589 Irritable bowel syndrome without diarrhea: Secondary | ICD-10-CM | POA: Diagnosis not present

## 2021-03-05 DIAGNOSIS — M479 Spondylosis, unspecified: Secondary | ICD-10-CM | POA: Diagnosis not present

## 2021-03-05 DIAGNOSIS — G4733 Obstructive sleep apnea (adult) (pediatric): Secondary | ICD-10-CM | POA: Diagnosis not present

## 2021-03-05 DIAGNOSIS — E1169 Type 2 diabetes mellitus with other specified complication: Secondary | ICD-10-CM | POA: Diagnosis not present

## 2021-03-05 DIAGNOSIS — Z95828 Presence of other vascular implants and grafts: Secondary | ICD-10-CM | POA: Diagnosis not present

## 2021-03-05 DIAGNOSIS — Z79899 Other long term (current) drug therapy: Secondary | ICD-10-CM | POA: Diagnosis not present

## 2021-03-07 DIAGNOSIS — H903 Sensorineural hearing loss, bilateral: Secondary | ICD-10-CM | POA: Diagnosis not present

## 2021-03-07 DIAGNOSIS — E7800 Pure hypercholesterolemia, unspecified: Secondary | ICD-10-CM | POA: Diagnosis not present

## 2021-03-07 DIAGNOSIS — G834 Cauda equina syndrome: Secondary | ICD-10-CM | POA: Diagnosis not present

## 2021-03-07 DIAGNOSIS — Z981 Arthrodesis status: Secondary | ICD-10-CM | POA: Diagnosis not present

## 2021-03-07 DIAGNOSIS — M1712 Unilateral primary osteoarthritis, left knee: Secondary | ICD-10-CM | POA: Diagnosis not present

## 2021-03-07 DIAGNOSIS — H61303 Acquired stenosis of external ear canal, unspecified, bilateral: Secondary | ICD-10-CM | POA: Diagnosis not present

## 2021-03-07 DIAGNOSIS — Z4789 Encounter for other orthopedic aftercare: Secondary | ICD-10-CM | POA: Diagnosis not present

## 2021-03-07 DIAGNOSIS — M533 Sacrococcygeal disorders, not elsewhere classified: Secondary | ICD-10-CM | POA: Diagnosis not present

## 2021-03-07 DIAGNOSIS — H9319 Tinnitus, unspecified ear: Secondary | ICD-10-CM | POA: Diagnosis not present

## 2021-03-07 DIAGNOSIS — M479 Spondylosis, unspecified: Secondary | ICD-10-CM | POA: Diagnosis not present

## 2021-03-07 DIAGNOSIS — G4733 Obstructive sleep apnea (adult) (pediatric): Secondary | ICD-10-CM | POA: Diagnosis not present

## 2021-03-07 DIAGNOSIS — M419 Scoliosis, unspecified: Secondary | ICD-10-CM | POA: Diagnosis not present

## 2021-03-07 DIAGNOSIS — J300 Vasomotor rhinitis: Secondary | ICD-10-CM | POA: Diagnosis not present

## 2021-03-07 DIAGNOSIS — D649 Anemia, unspecified: Secondary | ICD-10-CM | POA: Diagnosis not present

## 2021-03-07 DIAGNOSIS — M5116 Intervertebral disc disorders with radiculopathy, lumbar region: Secondary | ICD-10-CM | POA: Diagnosis not present

## 2021-03-07 DIAGNOSIS — M4802 Spinal stenosis, cervical region: Secondary | ICD-10-CM | POA: Diagnosis not present

## 2021-03-07 DIAGNOSIS — K5732 Diverticulitis of large intestine without perforation or abscess without bleeding: Secondary | ICD-10-CM | POA: Diagnosis not present

## 2021-03-07 DIAGNOSIS — I10 Essential (primary) hypertension: Secondary | ICD-10-CM | POA: Diagnosis not present

## 2021-03-07 DIAGNOSIS — M48061 Spinal stenosis, lumbar region without neurogenic claudication: Secondary | ICD-10-CM | POA: Diagnosis not present

## 2021-03-07 DIAGNOSIS — E1140 Type 2 diabetes mellitus with diabetic neuropathy, unspecified: Secondary | ICD-10-CM | POA: Diagnosis not present

## 2021-03-07 DIAGNOSIS — H93299 Other abnormal auditory perceptions, unspecified ear: Secondary | ICD-10-CM | POA: Diagnosis not present

## 2021-03-11 DIAGNOSIS — M48061 Spinal stenosis, lumbar region without neurogenic claudication: Secondary | ICD-10-CM | POA: Diagnosis not present

## 2021-03-11 DIAGNOSIS — M5116 Intervertebral disc disorders with radiculopathy, lumbar region: Secondary | ICD-10-CM | POA: Diagnosis not present

## 2021-03-11 DIAGNOSIS — M1712 Unilateral primary osteoarthritis, left knee: Secondary | ICD-10-CM | POA: Diagnosis not present

## 2021-03-11 DIAGNOSIS — M4802 Spinal stenosis, cervical region: Secondary | ICD-10-CM | POA: Diagnosis not present

## 2021-03-11 DIAGNOSIS — G4733 Obstructive sleep apnea (adult) (pediatric): Secondary | ICD-10-CM | POA: Diagnosis not present

## 2021-03-11 DIAGNOSIS — M533 Sacrococcygeal disorders, not elsewhere classified: Secondary | ICD-10-CM | POA: Diagnosis not present

## 2021-03-11 DIAGNOSIS — H9319 Tinnitus, unspecified ear: Secondary | ICD-10-CM | POA: Diagnosis not present

## 2021-03-11 DIAGNOSIS — H93299 Other abnormal auditory perceptions, unspecified ear: Secondary | ICD-10-CM | POA: Diagnosis not present

## 2021-03-11 DIAGNOSIS — E7800 Pure hypercholesterolemia, unspecified: Secondary | ICD-10-CM | POA: Diagnosis not present

## 2021-03-11 DIAGNOSIS — M419 Scoliosis, unspecified: Secondary | ICD-10-CM | POA: Diagnosis not present

## 2021-03-11 DIAGNOSIS — D649 Anemia, unspecified: Secondary | ICD-10-CM | POA: Diagnosis not present

## 2021-03-11 DIAGNOSIS — H903 Sensorineural hearing loss, bilateral: Secondary | ICD-10-CM | POA: Diagnosis not present

## 2021-03-11 DIAGNOSIS — E1140 Type 2 diabetes mellitus with diabetic neuropathy, unspecified: Secondary | ICD-10-CM | POA: Diagnosis not present

## 2021-03-11 DIAGNOSIS — J300 Vasomotor rhinitis: Secondary | ICD-10-CM | POA: Diagnosis not present

## 2021-03-11 DIAGNOSIS — K5732 Diverticulitis of large intestine without perforation or abscess without bleeding: Secondary | ICD-10-CM | POA: Diagnosis not present

## 2021-03-11 DIAGNOSIS — H61303 Acquired stenosis of external ear canal, unspecified, bilateral: Secondary | ICD-10-CM | POA: Diagnosis not present

## 2021-03-11 DIAGNOSIS — Z4789 Encounter for other orthopedic aftercare: Secondary | ICD-10-CM | POA: Diagnosis not present

## 2021-03-11 DIAGNOSIS — I10 Essential (primary) hypertension: Secondary | ICD-10-CM | POA: Diagnosis not present

## 2021-03-11 DIAGNOSIS — M479 Spondylosis, unspecified: Secondary | ICD-10-CM | POA: Diagnosis not present

## 2021-03-11 DIAGNOSIS — Z981 Arthrodesis status: Secondary | ICD-10-CM | POA: Diagnosis not present

## 2021-03-11 DIAGNOSIS — G834 Cauda equina syndrome: Secondary | ICD-10-CM | POA: Diagnosis not present

## 2021-03-13 DIAGNOSIS — Z981 Arthrodesis status: Secondary | ICD-10-CM | POA: Diagnosis not present

## 2021-03-13 DIAGNOSIS — I10 Essential (primary) hypertension: Secondary | ICD-10-CM | POA: Diagnosis not present

## 2021-03-13 DIAGNOSIS — H61303 Acquired stenosis of external ear canal, unspecified, bilateral: Secondary | ICD-10-CM | POA: Diagnosis not present

## 2021-03-13 DIAGNOSIS — D649 Anemia, unspecified: Secondary | ICD-10-CM | POA: Diagnosis not present

## 2021-03-13 DIAGNOSIS — H903 Sensorineural hearing loss, bilateral: Secondary | ICD-10-CM | POA: Diagnosis not present

## 2021-03-13 DIAGNOSIS — H93299 Other abnormal auditory perceptions, unspecified ear: Secondary | ICD-10-CM | POA: Diagnosis not present

## 2021-03-13 DIAGNOSIS — M48061 Spinal stenosis, lumbar region without neurogenic claudication: Secondary | ICD-10-CM | POA: Diagnosis not present

## 2021-03-13 DIAGNOSIS — M5116 Intervertebral disc disorders with radiculopathy, lumbar region: Secondary | ICD-10-CM | POA: Diagnosis not present

## 2021-03-13 DIAGNOSIS — M533 Sacrococcygeal disorders, not elsewhere classified: Secondary | ICD-10-CM | POA: Diagnosis not present

## 2021-03-13 DIAGNOSIS — H9319 Tinnitus, unspecified ear: Secondary | ICD-10-CM | POA: Diagnosis not present

## 2021-03-13 DIAGNOSIS — K5732 Diverticulitis of large intestine without perforation or abscess without bleeding: Secondary | ICD-10-CM | POA: Diagnosis not present

## 2021-03-13 DIAGNOSIS — M419 Scoliosis, unspecified: Secondary | ICD-10-CM | POA: Diagnosis not present

## 2021-03-13 DIAGNOSIS — E1140 Type 2 diabetes mellitus with diabetic neuropathy, unspecified: Secondary | ICD-10-CM | POA: Diagnosis not present

## 2021-03-13 DIAGNOSIS — M479 Spondylosis, unspecified: Secondary | ICD-10-CM | POA: Diagnosis not present

## 2021-03-13 DIAGNOSIS — M1712 Unilateral primary osteoarthritis, left knee: Secondary | ICD-10-CM | POA: Diagnosis not present

## 2021-03-13 DIAGNOSIS — E7800 Pure hypercholesterolemia, unspecified: Secondary | ICD-10-CM | POA: Diagnosis not present

## 2021-03-13 DIAGNOSIS — G834 Cauda equina syndrome: Secondary | ICD-10-CM | POA: Diagnosis not present

## 2021-03-13 DIAGNOSIS — J300 Vasomotor rhinitis: Secondary | ICD-10-CM | POA: Diagnosis not present

## 2021-03-13 DIAGNOSIS — Z4789 Encounter for other orthopedic aftercare: Secondary | ICD-10-CM | POA: Diagnosis not present

## 2021-03-13 DIAGNOSIS — G4733 Obstructive sleep apnea (adult) (pediatric): Secondary | ICD-10-CM | POA: Diagnosis not present

## 2021-03-13 DIAGNOSIS — M4802 Spinal stenosis, cervical region: Secondary | ICD-10-CM | POA: Diagnosis not present

## 2021-03-16 DIAGNOSIS — E7800 Pure hypercholesterolemia, unspecified: Secondary | ICD-10-CM | POA: Diagnosis not present

## 2021-03-16 DIAGNOSIS — M48061 Spinal stenosis, lumbar region without neurogenic claudication: Secondary | ICD-10-CM | POA: Diagnosis not present

## 2021-03-16 DIAGNOSIS — M479 Spondylosis, unspecified: Secondary | ICD-10-CM | POA: Diagnosis not present

## 2021-03-16 DIAGNOSIS — Z4789 Encounter for other orthopedic aftercare: Secondary | ICD-10-CM | POA: Diagnosis not present

## 2021-03-16 DIAGNOSIS — Z981 Arthrodesis status: Secondary | ICD-10-CM | POA: Diagnosis not present

## 2021-03-16 DIAGNOSIS — H61303 Acquired stenosis of external ear canal, unspecified, bilateral: Secondary | ICD-10-CM | POA: Diagnosis not present

## 2021-03-16 DIAGNOSIS — I10 Essential (primary) hypertension: Secondary | ICD-10-CM | POA: Diagnosis not present

## 2021-03-16 DIAGNOSIS — M4802 Spinal stenosis, cervical region: Secondary | ICD-10-CM | POA: Diagnosis not present

## 2021-03-16 DIAGNOSIS — H9319 Tinnitus, unspecified ear: Secondary | ICD-10-CM | POA: Diagnosis not present

## 2021-03-16 DIAGNOSIS — M533 Sacrococcygeal disorders, not elsewhere classified: Secondary | ICD-10-CM | POA: Diagnosis not present

## 2021-03-16 DIAGNOSIS — G834 Cauda equina syndrome: Secondary | ICD-10-CM | POA: Diagnosis not present

## 2021-03-16 DIAGNOSIS — M419 Scoliosis, unspecified: Secondary | ICD-10-CM | POA: Diagnosis not present

## 2021-03-16 DIAGNOSIS — J300 Vasomotor rhinitis: Secondary | ICD-10-CM | POA: Diagnosis not present

## 2021-03-16 DIAGNOSIS — D649 Anemia, unspecified: Secondary | ICD-10-CM | POA: Diagnosis not present

## 2021-03-16 DIAGNOSIS — H93299 Other abnormal auditory perceptions, unspecified ear: Secondary | ICD-10-CM | POA: Diagnosis not present

## 2021-03-16 DIAGNOSIS — G4733 Obstructive sleep apnea (adult) (pediatric): Secondary | ICD-10-CM | POA: Diagnosis not present

## 2021-03-16 DIAGNOSIS — E1140 Type 2 diabetes mellitus with diabetic neuropathy, unspecified: Secondary | ICD-10-CM | POA: Diagnosis not present

## 2021-03-16 DIAGNOSIS — H903 Sensorineural hearing loss, bilateral: Secondary | ICD-10-CM | POA: Diagnosis not present

## 2021-03-16 DIAGNOSIS — K5732 Diverticulitis of large intestine without perforation or abscess without bleeding: Secondary | ICD-10-CM | POA: Diagnosis not present

## 2021-03-16 DIAGNOSIS — M1712 Unilateral primary osteoarthritis, left knee: Secondary | ICD-10-CM | POA: Diagnosis not present

## 2021-03-16 DIAGNOSIS — M5116 Intervertebral disc disorders with radiculopathy, lumbar region: Secondary | ICD-10-CM | POA: Diagnosis not present

## 2021-03-19 DIAGNOSIS — H903 Sensorineural hearing loss, bilateral: Secondary | ICD-10-CM | POA: Diagnosis not present

## 2021-03-19 DIAGNOSIS — M1712 Unilateral primary osteoarthritis, left knee: Secondary | ICD-10-CM | POA: Diagnosis not present

## 2021-03-19 DIAGNOSIS — G4733 Obstructive sleep apnea (adult) (pediatric): Secondary | ICD-10-CM | POA: Diagnosis not present

## 2021-03-19 DIAGNOSIS — M533 Sacrococcygeal disorders, not elsewhere classified: Secondary | ICD-10-CM | POA: Diagnosis not present

## 2021-03-19 DIAGNOSIS — E7800 Pure hypercholesterolemia, unspecified: Secondary | ICD-10-CM | POA: Diagnosis not present

## 2021-03-19 DIAGNOSIS — M479 Spondylosis, unspecified: Secondary | ICD-10-CM | POA: Diagnosis not present

## 2021-03-19 DIAGNOSIS — M419 Scoliosis, unspecified: Secondary | ICD-10-CM | POA: Diagnosis not present

## 2021-03-19 DIAGNOSIS — M5116 Intervertebral disc disorders with radiculopathy, lumbar region: Secondary | ICD-10-CM | POA: Diagnosis not present

## 2021-03-19 DIAGNOSIS — H93299 Other abnormal auditory perceptions, unspecified ear: Secondary | ICD-10-CM | POA: Diagnosis not present

## 2021-03-19 DIAGNOSIS — H61303 Acquired stenosis of external ear canal, unspecified, bilateral: Secondary | ICD-10-CM | POA: Diagnosis not present

## 2021-03-19 DIAGNOSIS — H9319 Tinnitus, unspecified ear: Secondary | ICD-10-CM | POA: Diagnosis not present

## 2021-03-19 DIAGNOSIS — Z4789 Encounter for other orthopedic aftercare: Secondary | ICD-10-CM | POA: Diagnosis not present

## 2021-03-19 DIAGNOSIS — M4802 Spinal stenosis, cervical region: Secondary | ICD-10-CM | POA: Diagnosis not present

## 2021-03-19 DIAGNOSIS — M48061 Spinal stenosis, lumbar region without neurogenic claudication: Secondary | ICD-10-CM | POA: Diagnosis not present

## 2021-03-19 DIAGNOSIS — K5732 Diverticulitis of large intestine without perforation or abscess without bleeding: Secondary | ICD-10-CM | POA: Diagnosis not present

## 2021-03-19 DIAGNOSIS — G834 Cauda equina syndrome: Secondary | ICD-10-CM | POA: Diagnosis not present

## 2021-03-19 DIAGNOSIS — J300 Vasomotor rhinitis: Secondary | ICD-10-CM | POA: Diagnosis not present

## 2021-03-19 DIAGNOSIS — E1140 Type 2 diabetes mellitus with diabetic neuropathy, unspecified: Secondary | ICD-10-CM | POA: Diagnosis not present

## 2021-03-19 DIAGNOSIS — K5900 Constipation, unspecified: Secondary | ICD-10-CM | POA: Diagnosis not present

## 2021-03-19 DIAGNOSIS — Z981 Arthrodesis status: Secondary | ICD-10-CM | POA: Diagnosis not present

## 2021-03-19 DIAGNOSIS — I10 Essential (primary) hypertension: Secondary | ICD-10-CM | POA: Diagnosis not present

## 2021-03-19 DIAGNOSIS — D649 Anemia, unspecified: Secondary | ICD-10-CM | POA: Diagnosis not present

## 2021-03-24 DIAGNOSIS — M1712 Unilateral primary osteoarthritis, left knee: Secondary | ICD-10-CM | POA: Diagnosis not present

## 2021-03-24 DIAGNOSIS — D649 Anemia, unspecified: Secondary | ICD-10-CM | POA: Diagnosis not present

## 2021-03-24 DIAGNOSIS — M48061 Spinal stenosis, lumbar region without neurogenic claudication: Secondary | ICD-10-CM | POA: Diagnosis not present

## 2021-03-24 DIAGNOSIS — K5732 Diverticulitis of large intestine without perforation or abscess without bleeding: Secondary | ICD-10-CM | POA: Diagnosis not present

## 2021-03-24 DIAGNOSIS — H9319 Tinnitus, unspecified ear: Secondary | ICD-10-CM | POA: Diagnosis not present

## 2021-03-24 DIAGNOSIS — G4733 Obstructive sleep apnea (adult) (pediatric): Secondary | ICD-10-CM | POA: Diagnosis not present

## 2021-03-24 DIAGNOSIS — H93299 Other abnormal auditory perceptions, unspecified ear: Secondary | ICD-10-CM | POA: Diagnosis not present

## 2021-03-24 DIAGNOSIS — E7800 Pure hypercholesterolemia, unspecified: Secondary | ICD-10-CM | POA: Diagnosis not present

## 2021-03-24 DIAGNOSIS — M419 Scoliosis, unspecified: Secondary | ICD-10-CM | POA: Diagnosis not present

## 2021-03-24 DIAGNOSIS — E1140 Type 2 diabetes mellitus with diabetic neuropathy, unspecified: Secondary | ICD-10-CM | POA: Diagnosis not present

## 2021-03-24 DIAGNOSIS — J300 Vasomotor rhinitis: Secondary | ICD-10-CM | POA: Diagnosis not present

## 2021-03-24 DIAGNOSIS — M479 Spondylosis, unspecified: Secondary | ICD-10-CM | POA: Diagnosis not present

## 2021-03-24 DIAGNOSIS — Z981 Arthrodesis status: Secondary | ICD-10-CM | POA: Diagnosis not present

## 2021-03-24 DIAGNOSIS — I10 Essential (primary) hypertension: Secondary | ICD-10-CM | POA: Diagnosis not present

## 2021-03-24 DIAGNOSIS — M4802 Spinal stenosis, cervical region: Secondary | ICD-10-CM | POA: Diagnosis not present

## 2021-03-24 DIAGNOSIS — H61303 Acquired stenosis of external ear canal, unspecified, bilateral: Secondary | ICD-10-CM | POA: Diagnosis not present

## 2021-03-24 DIAGNOSIS — M533 Sacrococcygeal disorders, not elsewhere classified: Secondary | ICD-10-CM | POA: Diagnosis not present

## 2021-03-24 DIAGNOSIS — H903 Sensorineural hearing loss, bilateral: Secondary | ICD-10-CM | POA: Diagnosis not present

## 2021-03-24 DIAGNOSIS — G834 Cauda equina syndrome: Secondary | ICD-10-CM | POA: Diagnosis not present

## 2021-03-24 DIAGNOSIS — Z4789 Encounter for other orthopedic aftercare: Secondary | ICD-10-CM | POA: Diagnosis not present

## 2021-03-24 DIAGNOSIS — M5116 Intervertebral disc disorders with radiculopathy, lumbar region: Secondary | ICD-10-CM | POA: Diagnosis not present

## 2021-03-25 DIAGNOSIS — J300 Vasomotor rhinitis: Secondary | ICD-10-CM | POA: Diagnosis not present

## 2021-03-25 DIAGNOSIS — M48061 Spinal stenosis, lumbar region without neurogenic claudication: Secondary | ICD-10-CM | POA: Diagnosis not present

## 2021-03-25 DIAGNOSIS — D649 Anemia, unspecified: Secondary | ICD-10-CM | POA: Diagnosis not present

## 2021-03-25 DIAGNOSIS — H9319 Tinnitus, unspecified ear: Secondary | ICD-10-CM | POA: Diagnosis not present

## 2021-03-25 DIAGNOSIS — I10 Essential (primary) hypertension: Secondary | ICD-10-CM | POA: Diagnosis not present

## 2021-03-25 DIAGNOSIS — M1712 Unilateral primary osteoarthritis, left knee: Secondary | ICD-10-CM | POA: Diagnosis not present

## 2021-03-25 DIAGNOSIS — H903 Sensorineural hearing loss, bilateral: Secondary | ICD-10-CM | POA: Diagnosis not present

## 2021-03-25 DIAGNOSIS — E7800 Pure hypercholesterolemia, unspecified: Secondary | ICD-10-CM | POA: Diagnosis not present

## 2021-03-25 DIAGNOSIS — K5732 Diverticulitis of large intestine without perforation or abscess without bleeding: Secondary | ICD-10-CM | POA: Diagnosis not present

## 2021-03-25 DIAGNOSIS — H61303 Acquired stenosis of external ear canal, unspecified, bilateral: Secondary | ICD-10-CM | POA: Diagnosis not present

## 2021-03-25 DIAGNOSIS — M479 Spondylosis, unspecified: Secondary | ICD-10-CM | POA: Diagnosis not present

## 2021-03-25 DIAGNOSIS — M419 Scoliosis, unspecified: Secondary | ICD-10-CM | POA: Diagnosis not present

## 2021-03-25 DIAGNOSIS — Z4789 Encounter for other orthopedic aftercare: Secondary | ICD-10-CM | POA: Diagnosis not present

## 2021-03-25 DIAGNOSIS — M4802 Spinal stenosis, cervical region: Secondary | ICD-10-CM | POA: Diagnosis not present

## 2021-03-25 DIAGNOSIS — Z981 Arthrodesis status: Secondary | ICD-10-CM | POA: Diagnosis not present

## 2021-03-25 DIAGNOSIS — E1140 Type 2 diabetes mellitus with diabetic neuropathy, unspecified: Secondary | ICD-10-CM | POA: Diagnosis not present

## 2021-03-25 DIAGNOSIS — M5116 Intervertebral disc disorders with radiculopathy, lumbar region: Secondary | ICD-10-CM | POA: Diagnosis not present

## 2021-03-25 DIAGNOSIS — M533 Sacrococcygeal disorders, not elsewhere classified: Secondary | ICD-10-CM | POA: Diagnosis not present

## 2021-03-25 DIAGNOSIS — G4733 Obstructive sleep apnea (adult) (pediatric): Secondary | ICD-10-CM | POA: Diagnosis not present

## 2021-03-25 DIAGNOSIS — G834 Cauda equina syndrome: Secondary | ICD-10-CM | POA: Diagnosis not present

## 2021-03-25 DIAGNOSIS — H93299 Other abnormal auditory perceptions, unspecified ear: Secondary | ICD-10-CM | POA: Diagnosis not present

## 2021-03-27 DIAGNOSIS — M25562 Pain in left knee: Secondary | ICD-10-CM | POA: Diagnosis not present

## 2021-03-27 DIAGNOSIS — M4802 Spinal stenosis, cervical region: Secondary | ICD-10-CM | POA: Diagnosis not present

## 2021-04-28 ENCOUNTER — Other Ambulatory Visit: Payer: Self-pay | Admitting: Sports Medicine

## 2021-04-28 ENCOUNTER — Ambulatory Visit: Payer: Medicare Other | Admitting: Sports Medicine

## 2021-04-28 ENCOUNTER — Other Ambulatory Visit: Payer: Self-pay

## 2021-04-28 ENCOUNTER — Encounter: Payer: Self-pay | Admitting: Sports Medicine

## 2021-04-28 ENCOUNTER — Ambulatory Visit (INDEPENDENT_AMBULATORY_CARE_PROVIDER_SITE_OTHER): Payer: Medicare Other

## 2021-04-28 DIAGNOSIS — M19072 Primary osteoarthritis, left ankle and foot: Secondary | ICD-10-CM

## 2021-04-28 DIAGNOSIS — M2042 Other hammer toe(s) (acquired), left foot: Secondary | ICD-10-CM

## 2021-04-28 DIAGNOSIS — M79674 Pain in right toe(s): Secondary | ICD-10-CM | POA: Diagnosis not present

## 2021-04-28 DIAGNOSIS — B351 Tinea unguium: Secondary | ICD-10-CM

## 2021-04-28 DIAGNOSIS — M79675 Pain in left toe(s): Secondary | ICD-10-CM | POA: Diagnosis not present

## 2021-04-28 NOTE — Progress Notes (Signed)
Subjective: [name] is a 87 y.o. male patient who presents to office for evaluation of Left foot pain. Patient complains of occassional pain especially over the last several years in the left 2nd toe that rubs against shoe, 3/10. Has not tried any treatment. Patient also requests nail trim, can no bend to cut nails anymore.  Patient denies any other pedal complaints.   Patient Active Problem List   Diagnosis Date Noted  . H/O total ankle replacement, left 03/14/2018  . Hammertoe of left foot 12/20/2016  . Metatarsalgia, left foot 12/20/2016  . 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 Acquired pes planus of both feet 08/26/2016  . Dislocation of left knee with lateral meniscus tear 06/03/2016  . Ankle arthritis 05/06/2016  . Ingrowing nail 01/28/2016  . Ingrown nail of great toe of left foot 01/28/2016  . History of arthroscopy of knee 03/31/2015  . S/P left knee arthroscopy 03/31/2015  . Knee torn cartilage 03/13/2015  . Tear of meniscus of left knee 03/13/2015  . Gonalgia 02/24/2015  . Ptosis, both eyelids 11/13/2014  . Spinal stenosis in cervical region 10/10/2013  . Disease of spinal cord ([place]) 10/10/2013  . Cervical spondylosis with myelopathy 10/10/2013  . Degeneration of intervertebral disc of lumbosacral region 04/28/2013    Current Outpatient Medications on File Prior to Visit  Medication Sig Dispense Refill  . aspirin EC 81 MG tablet Take 1 tablet (81 mg total) by mouth 2 (two) times daily. 84 tablet 0  . atorvastatin (LIPITOR) 10 MG tablet Take 10 mg by mouth at bedtime.     . docusate sodium (COLACE) 100 MG capsule Take 1 capsule (100 mg total) by mouth 2 (two) times daily. While taking narcotic pain medicine. 30 capsule 0  . losartan (COZAAR) 100 MG tablet Take 100 mg by mouth at bedtime.    . metoprolol (LOPRESSOR) 100 MG tablet Take 100 mg by mouth 2 (two) times daily.    . Multiple Vitamin (MULTIVITAMIN WITH MINERALS) TABS Take 1 tablet by mouth daily.     . ondansetron (ZOFRAN-ODT) 4 MG disintegrating tablet Take 8 mg by mouth every 6 (six) hours as needed.    Marland Kitchen oxyCODONE (ROXICODONE) 5 MG immediate release tablet Take 1 tablet (5 mg total) by mouth every 4 (four) hours as needed for moderate pain or severe pain. For no more than 5 days. 30 tablet 0  . senna (SENOKOT) 8.6 MG TABS tablet Take 2 tablets (17.2 mg total) by mouth 2 (two) times daily. 30 each 0  . tamsulosin (FLOMAX) 0.4 MG CAPS capsule SMARTSIG:1 Capsule(s) By Mouth Every Evening    . traMADol (ULTRAM) 50 MG tablet Take 50 mg by mouth every 4 (four) hours as needed.     No current facility-administered medications on file prior to visit.    No Known Allergies  Objective:  General: Alert and oriented x3 in no acute distress  Dermatology: No open lesions bilateral lower extremities, no webspace macerations, no ecchymosis bilateral, all nails x 10 are thick and elongated consistent with onychomycosis. Old scar at left ankle.  Vascular: Dorsalis Pedis and Posterior Tibial pedal pulses 1/4, Capillary Fill Time 5 seconds,(+) pedal hair growth bilateral, no edema bilateral lower extremities, Temperature gradient within normal limits.  Neurology: Johney Maine sensation intact via light touch bilateral.   Musculoskeletal: Rigid left 2nd hammertoe with Mild tenderness with palpation at PIPJ, + bunion deformity noted bilateral. No pain with calf compression bilateral.  Strength within normal limits  in all groups bilateral for patient age with limited ROM to all joints. History of TAR on left and ankle fracture on right.    Gait: [place] assisted.  Xrays  Left Foot    Impression: Digital contracture with 2nd toe most involved and joint space narrowing consistent with arthritis. Ankle implant intact.        Assessment and Plan: Problem List Items Addressed This Visit      Musculoskeletal and Integument   Hammertoe of left foot - Primary    Other Visit Diagnoses    Toe pain, left        Arthritis of left foot       Relevant Medications   traMADol (ULTRAM) 50 MG tablet   Pain due to onychomycosis of toenails of both feet           -Complete examination performed -Xrays reviewed -Discussed treatement options -Rx Buddin hammertoe regulator -Recommend good supportive shoes that do not rub toe -Mechanically debrided nails x 10 using sterile nail nipper without incident  -Patient to return to office as needed or sooner if condition worsens.  Landis Martins, DPM

## 2021-05-06 DIAGNOSIS — R6889 Other general symptoms and signs: Secondary | ICD-10-CM | POA: Diagnosis not present

## 2021-05-06 DIAGNOSIS — Z87891 Personal history of nicotine dependence: Secondary | ICD-10-CM | POA: Diagnosis not present

## 2021-05-06 DIAGNOSIS — I351 Nonrheumatic aortic (valve) insufficiency: Secondary | ICD-10-CM | POA: Diagnosis not present

## 2021-05-06 DIAGNOSIS — Z743 Need for continuous supervision: Secondary | ICD-10-CM | POA: Diagnosis not present

## 2021-05-06 DIAGNOSIS — Z888 Allergy status to other drugs, medicaments and biological substances status: Secondary | ICD-10-CM | POA: Diagnosis not present

## 2021-05-06 DIAGNOSIS — R531 Weakness: Secondary | ICD-10-CM | POA: Diagnosis not present

## 2021-05-06 DIAGNOSIS — R1111 Vomiting without nausea: Secondary | ICD-10-CM | POA: Diagnosis not present

## 2021-05-06 DIAGNOSIS — I340 Nonrheumatic mitral (valve) insufficiency: Secondary | ICD-10-CM | POA: Diagnosis not present

## 2021-05-06 DIAGNOSIS — I700 Atherosclerosis of aorta: Secondary | ICD-10-CM | POA: Diagnosis not present

## 2021-05-06 DIAGNOSIS — Z79899 Other long term (current) drug therapy: Secondary | ICD-10-CM | POA: Diagnosis not present

## 2021-05-06 DIAGNOSIS — R270 Ataxia, unspecified: Secondary | ICD-10-CM | POA: Diagnosis not present

## 2021-05-06 DIAGNOSIS — I6601 Occlusion and stenosis of right middle cerebral artery: Secondary | ICD-10-CM | POA: Diagnosis not present

## 2021-05-06 DIAGNOSIS — R110 Nausea: Secondary | ICD-10-CM | POA: Diagnosis not present

## 2021-05-06 DIAGNOSIS — M1990 Unspecified osteoarthritis, unspecified site: Secondary | ICD-10-CM | POA: Diagnosis not present

## 2021-05-06 DIAGNOSIS — I6389 Other cerebral infarction: Secondary | ICD-10-CM | POA: Diagnosis not present

## 2021-05-06 DIAGNOSIS — R0902 Hypoxemia: Secondary | ICD-10-CM | POA: Diagnosis not present

## 2021-05-06 DIAGNOSIS — E785 Hyperlipidemia, unspecified: Secondary | ICD-10-CM | POA: Diagnosis not present

## 2021-05-06 DIAGNOSIS — I6523 Occlusion and stenosis of bilateral carotid arteries: Secondary | ICD-10-CM | POA: Diagnosis not present

## 2021-05-06 DIAGNOSIS — R42 Dizziness and giddiness: Secondary | ICD-10-CM | POA: Diagnosis not present

## 2021-05-06 DIAGNOSIS — I6501 Occlusion and stenosis of right vertebral artery: Secondary | ICD-10-CM | POA: Diagnosis not present

## 2021-05-06 DIAGNOSIS — G319 Degenerative disease of nervous system, unspecified: Secondary | ICD-10-CM | POA: Diagnosis not present

## 2021-05-06 DIAGNOSIS — R29818 Other symptoms and signs involving the nervous system: Secondary | ICD-10-CM | POA: Diagnosis not present

## 2021-05-06 DIAGNOSIS — I10 Essential (primary) hypertension: Secondary | ICD-10-CM | POA: Diagnosis not present

## 2021-05-06 DIAGNOSIS — I69954 Hemiplegia and hemiparesis following unspecified cerebrovascular disease affecting left non-dominant side: Secondary | ICD-10-CM | POA: Diagnosis not present

## 2021-05-06 DIAGNOSIS — I951 Orthostatic hypotension: Secondary | ICD-10-CM | POA: Diagnosis not present

## 2021-05-07 DIAGNOSIS — I6389 Other cerebral infarction: Secondary | ICD-10-CM

## 2021-05-10 DIAGNOSIS — G4733 Obstructive sleep apnea (adult) (pediatric): Secondary | ICD-10-CM | POA: Diagnosis not present

## 2021-05-10 DIAGNOSIS — M47812 Spondylosis without myelopathy or radiculopathy, cervical region: Secondary | ICD-10-CM | POA: Diagnosis not present

## 2021-05-10 DIAGNOSIS — H3530 Unspecified macular degeneration: Secondary | ICD-10-CM | POA: Diagnosis not present

## 2021-05-10 DIAGNOSIS — H269 Unspecified cataract: Secondary | ICD-10-CM | POA: Diagnosis not present

## 2021-05-10 DIAGNOSIS — E7800 Pure hypercholesterolemia, unspecified: Secondary | ICD-10-CM | POA: Diagnosis not present

## 2021-05-10 DIAGNOSIS — K429 Umbilical hernia without obstruction or gangrene: Secondary | ICD-10-CM | POA: Diagnosis not present

## 2021-05-10 DIAGNOSIS — I951 Orthostatic hypotension: Secondary | ICD-10-CM | POA: Diagnosis not present

## 2021-05-10 DIAGNOSIS — I700 Atherosclerosis of aorta: Secondary | ICD-10-CM | POA: Diagnosis not present

## 2021-05-10 DIAGNOSIS — E1140 Type 2 diabetes mellitus with diabetic neuropathy, unspecified: Secondary | ICD-10-CM | POA: Diagnosis not present

## 2021-05-10 DIAGNOSIS — K4090 Unilateral inguinal hernia, without obstruction or gangrene, not specified as recurrent: Secondary | ICD-10-CM | POA: Diagnosis not present

## 2021-05-10 DIAGNOSIS — M5416 Radiculopathy, lumbar region: Secondary | ICD-10-CM | POA: Diagnosis not present

## 2021-05-10 DIAGNOSIS — K5732 Diverticulitis of large intestine without perforation or abscess without bleeding: Secondary | ICD-10-CM | POA: Diagnosis not present

## 2021-05-10 DIAGNOSIS — M419 Scoliosis, unspecified: Secondary | ICD-10-CM | POA: Diagnosis not present

## 2021-05-10 DIAGNOSIS — D649 Anemia, unspecified: Secondary | ICD-10-CM | POA: Diagnosis not present

## 2021-05-10 DIAGNOSIS — M5136 Other intervertebral disc degeneration, lumbar region: Secondary | ICD-10-CM | POA: Diagnosis not present

## 2021-05-10 DIAGNOSIS — K635 Polyp of colon: Secondary | ICD-10-CM | POA: Diagnosis not present

## 2021-05-10 DIAGNOSIS — M4802 Spinal stenosis, cervical region: Secondary | ICD-10-CM | POA: Diagnosis not present

## 2021-05-10 DIAGNOSIS — E1136 Type 2 diabetes mellitus with diabetic cataract: Secondary | ICD-10-CM | POA: Diagnosis not present

## 2021-05-10 DIAGNOSIS — H9311 Tinnitus, right ear: Secondary | ICD-10-CM | POA: Diagnosis not present

## 2021-05-10 DIAGNOSIS — H9191 Unspecified hearing loss, right ear: Secondary | ICD-10-CM | POA: Diagnosis not present

## 2021-05-10 DIAGNOSIS — I10 Essential (primary) hypertension: Secondary | ICD-10-CM | POA: Diagnosis not present

## 2021-05-10 DIAGNOSIS — M48061 Spinal stenosis, lumbar region without neurogenic claudication: Secondary | ICD-10-CM | POA: Diagnosis not present

## 2021-05-12 DIAGNOSIS — H538 Other visual disturbances: Secondary | ICD-10-CM | POA: Diagnosis not present

## 2021-05-12 DIAGNOSIS — R2990 Unspecified symptoms and signs involving the nervous system: Secondary | ICD-10-CM | POA: Diagnosis not present

## 2021-05-12 DIAGNOSIS — I10 Essential (primary) hypertension: Secondary | ICD-10-CM | POA: Diagnosis not present

## 2021-05-12 DIAGNOSIS — R42 Dizziness and giddiness: Secondary | ICD-10-CM | POA: Diagnosis not present

## 2021-05-12 DIAGNOSIS — I951 Orthostatic hypotension: Secondary | ICD-10-CM | POA: Diagnosis not present

## 2021-05-12 DIAGNOSIS — R110 Nausea: Secondary | ICD-10-CM | POA: Diagnosis not present

## 2021-05-14 DIAGNOSIS — M419 Scoliosis, unspecified: Secondary | ICD-10-CM | POA: Diagnosis not present

## 2021-05-14 DIAGNOSIS — I10 Essential (primary) hypertension: Secondary | ICD-10-CM | POA: Diagnosis not present

## 2021-05-14 DIAGNOSIS — M5416 Radiculopathy, lumbar region: Secondary | ICD-10-CM | POA: Diagnosis not present

## 2021-05-14 DIAGNOSIS — M48061 Spinal stenosis, lumbar region without neurogenic claudication: Secondary | ICD-10-CM | POA: Diagnosis not present

## 2021-05-14 DIAGNOSIS — M47812 Spondylosis without myelopathy or radiculopathy, cervical region: Secondary | ICD-10-CM | POA: Diagnosis not present

## 2021-05-14 DIAGNOSIS — M5136 Other intervertebral disc degeneration, lumbar region: Secondary | ICD-10-CM | POA: Diagnosis not present

## 2021-05-14 DIAGNOSIS — D649 Anemia, unspecified: Secondary | ICD-10-CM | POA: Diagnosis not present

## 2021-05-14 DIAGNOSIS — K429 Umbilical hernia without obstruction or gangrene: Secondary | ICD-10-CM | POA: Diagnosis not present

## 2021-05-14 DIAGNOSIS — H269 Unspecified cataract: Secondary | ICD-10-CM | POA: Diagnosis not present

## 2021-05-14 DIAGNOSIS — K5732 Diverticulitis of large intestine without perforation or abscess without bleeding: Secondary | ICD-10-CM | POA: Diagnosis not present

## 2021-05-14 DIAGNOSIS — E1136 Type 2 diabetes mellitus with diabetic cataract: Secondary | ICD-10-CM | POA: Diagnosis not present

## 2021-05-14 DIAGNOSIS — H9191 Unspecified hearing loss, right ear: Secondary | ICD-10-CM | POA: Diagnosis not present

## 2021-05-14 DIAGNOSIS — H3530 Unspecified macular degeneration: Secondary | ICD-10-CM | POA: Diagnosis not present

## 2021-05-14 DIAGNOSIS — K4090 Unilateral inguinal hernia, without obstruction or gangrene, not specified as recurrent: Secondary | ICD-10-CM | POA: Diagnosis not present

## 2021-05-14 DIAGNOSIS — G4733 Obstructive sleep apnea (adult) (pediatric): Secondary | ICD-10-CM | POA: Diagnosis not present

## 2021-05-14 DIAGNOSIS — I951 Orthostatic hypotension: Secondary | ICD-10-CM | POA: Diagnosis not present

## 2021-05-14 DIAGNOSIS — K635 Polyp of colon: Secondary | ICD-10-CM | POA: Diagnosis not present

## 2021-05-14 DIAGNOSIS — M4802 Spinal stenosis, cervical region: Secondary | ICD-10-CM | POA: Diagnosis not present

## 2021-05-14 DIAGNOSIS — I700 Atherosclerosis of aorta: Secondary | ICD-10-CM | POA: Diagnosis not present

## 2021-05-14 DIAGNOSIS — E1140 Type 2 diabetes mellitus with diabetic neuropathy, unspecified: Secondary | ICD-10-CM | POA: Diagnosis not present

## 2021-05-14 DIAGNOSIS — E7800 Pure hypercholesterolemia, unspecified: Secondary | ICD-10-CM | POA: Diagnosis not present

## 2021-05-14 DIAGNOSIS — H9311 Tinnitus, right ear: Secondary | ICD-10-CM | POA: Diagnosis not present

## 2021-05-20 DIAGNOSIS — M21372 Foot drop, left foot: Secondary | ICD-10-CM | POA: Diagnosis not present

## 2021-05-20 DIAGNOSIS — M5416 Radiculopathy, lumbar region: Secondary | ICD-10-CM | POA: Diagnosis not present

## 2021-05-20 DIAGNOSIS — L290 Pruritus ani: Secondary | ICD-10-CM | POA: Diagnosis not present

## 2021-05-20 DIAGNOSIS — E785 Hyperlipidemia, unspecified: Secondary | ICD-10-CM | POA: Diagnosis not present

## 2021-05-20 DIAGNOSIS — R7301 Impaired fasting glucose: Secondary | ICD-10-CM | POA: Diagnosis not present

## 2021-05-20 DIAGNOSIS — I10 Essential (primary) hypertension: Secondary | ICD-10-CM | POA: Diagnosis not present

## 2021-05-20 DIAGNOSIS — I700 Atherosclerosis of aorta: Secondary | ICD-10-CM | POA: Diagnosis not present

## 2021-05-20 DIAGNOSIS — Z86718 Personal history of other venous thrombosis and embolism: Secondary | ICD-10-CM | POA: Diagnosis not present

## 2021-05-21 DIAGNOSIS — K635 Polyp of colon: Secondary | ICD-10-CM | POA: Diagnosis not present

## 2021-05-21 DIAGNOSIS — I951 Orthostatic hypotension: Secondary | ICD-10-CM | POA: Diagnosis not present

## 2021-05-21 DIAGNOSIS — H9191 Unspecified hearing loss, right ear: Secondary | ICD-10-CM | POA: Diagnosis not present

## 2021-05-21 DIAGNOSIS — M5136 Other intervertebral disc degeneration, lumbar region: Secondary | ICD-10-CM | POA: Diagnosis not present

## 2021-05-21 DIAGNOSIS — D649 Anemia, unspecified: Secondary | ICD-10-CM | POA: Diagnosis not present

## 2021-05-21 DIAGNOSIS — H9311 Tinnitus, right ear: Secondary | ICD-10-CM | POA: Diagnosis not present

## 2021-05-21 DIAGNOSIS — M47812 Spondylosis without myelopathy or radiculopathy, cervical region: Secondary | ICD-10-CM | POA: Diagnosis not present

## 2021-05-21 DIAGNOSIS — M5416 Radiculopathy, lumbar region: Secondary | ICD-10-CM | POA: Diagnosis not present

## 2021-05-21 DIAGNOSIS — G4733 Obstructive sleep apnea (adult) (pediatric): Secondary | ICD-10-CM | POA: Diagnosis not present

## 2021-05-21 DIAGNOSIS — M419 Scoliosis, unspecified: Secondary | ICD-10-CM | POA: Diagnosis not present

## 2021-05-21 DIAGNOSIS — K4090 Unilateral inguinal hernia, without obstruction or gangrene, not specified as recurrent: Secondary | ICD-10-CM | POA: Diagnosis not present

## 2021-05-21 DIAGNOSIS — H3530 Unspecified macular degeneration: Secondary | ICD-10-CM | POA: Diagnosis not present

## 2021-05-21 DIAGNOSIS — E7800 Pure hypercholesterolemia, unspecified: Secondary | ICD-10-CM | POA: Diagnosis not present

## 2021-05-21 DIAGNOSIS — I10 Essential (primary) hypertension: Secondary | ICD-10-CM | POA: Diagnosis not present

## 2021-05-21 DIAGNOSIS — K5732 Diverticulitis of large intestine without perforation or abscess without bleeding: Secondary | ICD-10-CM | POA: Diagnosis not present

## 2021-05-21 DIAGNOSIS — E1136 Type 2 diabetes mellitus with diabetic cataract: Secondary | ICD-10-CM | POA: Diagnosis not present

## 2021-05-21 DIAGNOSIS — K429 Umbilical hernia without obstruction or gangrene: Secondary | ICD-10-CM | POA: Diagnosis not present

## 2021-05-21 DIAGNOSIS — M48061 Spinal stenosis, lumbar region without neurogenic claudication: Secondary | ICD-10-CM | POA: Diagnosis not present

## 2021-05-21 DIAGNOSIS — I700 Atherosclerosis of aorta: Secondary | ICD-10-CM | POA: Diagnosis not present

## 2021-05-21 DIAGNOSIS — H269 Unspecified cataract: Secondary | ICD-10-CM | POA: Diagnosis not present

## 2021-05-21 DIAGNOSIS — M4802 Spinal stenosis, cervical region: Secondary | ICD-10-CM | POA: Diagnosis not present

## 2021-05-21 DIAGNOSIS — E1140 Type 2 diabetes mellitus with diabetic neuropathy, unspecified: Secondary | ICD-10-CM | POA: Diagnosis not present

## 2021-05-26 DIAGNOSIS — H9311 Tinnitus, right ear: Secondary | ICD-10-CM | POA: Diagnosis not present

## 2021-05-26 DIAGNOSIS — I951 Orthostatic hypotension: Secondary | ICD-10-CM | POA: Diagnosis not present

## 2021-05-26 DIAGNOSIS — D649 Anemia, unspecified: Secondary | ICD-10-CM | POA: Diagnosis not present

## 2021-05-26 DIAGNOSIS — H269 Unspecified cataract: Secondary | ICD-10-CM | POA: Diagnosis not present

## 2021-05-26 DIAGNOSIS — G4733 Obstructive sleep apnea (adult) (pediatric): Secondary | ICD-10-CM | POA: Diagnosis not present

## 2021-05-26 DIAGNOSIS — M5416 Radiculopathy, lumbar region: Secondary | ICD-10-CM | POA: Diagnosis not present

## 2021-05-26 DIAGNOSIS — H3530 Unspecified macular degeneration: Secondary | ICD-10-CM | POA: Diagnosis not present

## 2021-05-26 DIAGNOSIS — K5732 Diverticulitis of large intestine without perforation or abscess without bleeding: Secondary | ICD-10-CM | POA: Diagnosis not present

## 2021-05-26 DIAGNOSIS — E7800 Pure hypercholesterolemia, unspecified: Secondary | ICD-10-CM | POA: Diagnosis not present

## 2021-05-26 DIAGNOSIS — K429 Umbilical hernia without obstruction or gangrene: Secondary | ICD-10-CM | POA: Diagnosis not present

## 2021-05-26 DIAGNOSIS — M48061 Spinal stenosis, lumbar region without neurogenic claudication: Secondary | ICD-10-CM | POA: Diagnosis not present

## 2021-05-26 DIAGNOSIS — E1136 Type 2 diabetes mellitus with diabetic cataract: Secondary | ICD-10-CM | POA: Diagnosis not present

## 2021-05-26 DIAGNOSIS — I700 Atherosclerosis of aorta: Secondary | ICD-10-CM | POA: Diagnosis not present

## 2021-05-26 DIAGNOSIS — M47812 Spondylosis without myelopathy or radiculopathy, cervical region: Secondary | ICD-10-CM | POA: Diagnosis not present

## 2021-05-26 DIAGNOSIS — H9191 Unspecified hearing loss, right ear: Secondary | ICD-10-CM | POA: Diagnosis not present

## 2021-05-26 DIAGNOSIS — E1140 Type 2 diabetes mellitus with diabetic neuropathy, unspecified: Secondary | ICD-10-CM | POA: Diagnosis not present

## 2021-05-26 DIAGNOSIS — M419 Scoliosis, unspecified: Secondary | ICD-10-CM | POA: Diagnosis not present

## 2021-05-26 DIAGNOSIS — K4090 Unilateral inguinal hernia, without obstruction or gangrene, not specified as recurrent: Secondary | ICD-10-CM | POA: Diagnosis not present

## 2021-05-26 DIAGNOSIS — I10 Essential (primary) hypertension: Secondary | ICD-10-CM | POA: Diagnosis not present

## 2021-05-26 DIAGNOSIS — M4802 Spinal stenosis, cervical region: Secondary | ICD-10-CM | POA: Diagnosis not present

## 2021-05-26 DIAGNOSIS — K635 Polyp of colon: Secondary | ICD-10-CM | POA: Diagnosis not present

## 2021-05-26 DIAGNOSIS — M5136 Other intervertebral disc degeneration, lumbar region: Secondary | ICD-10-CM | POA: Diagnosis not present

## 2021-05-28 DIAGNOSIS — Z8673 Personal history of transient ischemic attack (TIA), and cerebral infarction without residual deficits: Secondary | ICD-10-CM | POA: Diagnosis not present

## 2021-05-28 DIAGNOSIS — I6521 Occlusion and stenosis of right carotid artery: Secondary | ICD-10-CM | POA: Diagnosis not present

## 2021-05-28 DIAGNOSIS — I6990 Unspecified sequelae of unspecified cerebrovascular disease: Secondary | ICD-10-CM | POA: Diagnosis not present

## 2021-06-02 DIAGNOSIS — K4090 Unilateral inguinal hernia, without obstruction or gangrene, not specified as recurrent: Secondary | ICD-10-CM | POA: Diagnosis not present

## 2021-06-02 DIAGNOSIS — E7800 Pure hypercholesterolemia, unspecified: Secondary | ICD-10-CM | POA: Diagnosis not present

## 2021-06-02 DIAGNOSIS — M48061 Spinal stenosis, lumbar region without neurogenic claudication: Secondary | ICD-10-CM | POA: Diagnosis not present

## 2021-06-02 DIAGNOSIS — M419 Scoliosis, unspecified: Secondary | ICD-10-CM | POA: Diagnosis not present

## 2021-06-02 DIAGNOSIS — I700 Atherosclerosis of aorta: Secondary | ICD-10-CM | POA: Diagnosis not present

## 2021-06-02 DIAGNOSIS — H9191 Unspecified hearing loss, right ear: Secondary | ICD-10-CM | POA: Diagnosis not present

## 2021-06-02 DIAGNOSIS — E1136 Type 2 diabetes mellitus with diabetic cataract: Secondary | ICD-10-CM | POA: Diagnosis not present

## 2021-06-02 DIAGNOSIS — I10 Essential (primary) hypertension: Secondary | ICD-10-CM | POA: Diagnosis not present

## 2021-06-02 DIAGNOSIS — G4733 Obstructive sleep apnea (adult) (pediatric): Secondary | ICD-10-CM | POA: Diagnosis not present

## 2021-06-02 DIAGNOSIS — M4802 Spinal stenosis, cervical region: Secondary | ICD-10-CM | POA: Diagnosis not present

## 2021-06-02 DIAGNOSIS — K635 Polyp of colon: Secondary | ICD-10-CM | POA: Diagnosis not present

## 2021-06-02 DIAGNOSIS — M5136 Other intervertebral disc degeneration, lumbar region: Secondary | ICD-10-CM | POA: Diagnosis not present

## 2021-06-02 DIAGNOSIS — M47812 Spondylosis without myelopathy or radiculopathy, cervical region: Secondary | ICD-10-CM | POA: Diagnosis not present

## 2021-06-02 DIAGNOSIS — M5416 Radiculopathy, lumbar region: Secondary | ICD-10-CM | POA: Diagnosis not present

## 2021-06-02 DIAGNOSIS — H269 Unspecified cataract: Secondary | ICD-10-CM | POA: Diagnosis not present

## 2021-06-02 DIAGNOSIS — D649 Anemia, unspecified: Secondary | ICD-10-CM | POA: Diagnosis not present

## 2021-06-02 DIAGNOSIS — H3530 Unspecified macular degeneration: Secondary | ICD-10-CM | POA: Diagnosis not present

## 2021-06-02 DIAGNOSIS — K429 Umbilical hernia without obstruction or gangrene: Secondary | ICD-10-CM | POA: Diagnosis not present

## 2021-06-02 DIAGNOSIS — I951 Orthostatic hypotension: Secondary | ICD-10-CM | POA: Diagnosis not present

## 2021-06-02 DIAGNOSIS — K5732 Diverticulitis of large intestine without perforation or abscess without bleeding: Secondary | ICD-10-CM | POA: Diagnosis not present

## 2021-06-02 DIAGNOSIS — H9311 Tinnitus, right ear: Secondary | ICD-10-CM | POA: Diagnosis not present

## 2021-06-02 DIAGNOSIS — E1140 Type 2 diabetes mellitus with diabetic neuropathy, unspecified: Secondary | ICD-10-CM | POA: Diagnosis not present

## 2021-06-03 DIAGNOSIS — H532 Diplopia: Secondary | ICD-10-CM | POA: Diagnosis not present

## 2021-06-08 DIAGNOSIS — M4802 Spinal stenosis, cervical region: Secondary | ICD-10-CM | POA: Diagnosis not present

## 2021-06-08 DIAGNOSIS — K4090 Unilateral inguinal hernia, without obstruction or gangrene, not specified as recurrent: Secondary | ICD-10-CM | POA: Diagnosis not present

## 2021-06-08 DIAGNOSIS — E1136 Type 2 diabetes mellitus with diabetic cataract: Secondary | ICD-10-CM | POA: Diagnosis not present

## 2021-06-08 DIAGNOSIS — M5136 Other intervertebral disc degeneration, lumbar region: Secondary | ICD-10-CM | POA: Diagnosis not present

## 2021-06-08 DIAGNOSIS — H3530 Unspecified macular degeneration: Secondary | ICD-10-CM | POA: Diagnosis not present

## 2021-06-08 DIAGNOSIS — H9191 Unspecified hearing loss, right ear: Secondary | ICD-10-CM | POA: Diagnosis not present

## 2021-06-08 DIAGNOSIS — D649 Anemia, unspecified: Secondary | ICD-10-CM | POA: Diagnosis not present

## 2021-06-08 DIAGNOSIS — I10 Essential (primary) hypertension: Secondary | ICD-10-CM | POA: Diagnosis not present

## 2021-06-08 DIAGNOSIS — K635 Polyp of colon: Secondary | ICD-10-CM | POA: Diagnosis not present

## 2021-06-08 DIAGNOSIS — K429 Umbilical hernia without obstruction or gangrene: Secondary | ICD-10-CM | POA: Diagnosis not present

## 2021-06-08 DIAGNOSIS — M47812 Spondylosis without myelopathy or radiculopathy, cervical region: Secondary | ICD-10-CM | POA: Diagnosis not present

## 2021-06-08 DIAGNOSIS — E7800 Pure hypercholesterolemia, unspecified: Secondary | ICD-10-CM | POA: Diagnosis not present

## 2021-06-08 DIAGNOSIS — M5416 Radiculopathy, lumbar region: Secondary | ICD-10-CM | POA: Diagnosis not present

## 2021-06-08 DIAGNOSIS — E1140 Type 2 diabetes mellitus with diabetic neuropathy, unspecified: Secondary | ICD-10-CM | POA: Diagnosis not present

## 2021-06-08 DIAGNOSIS — I700 Atherosclerosis of aorta: Secondary | ICD-10-CM | POA: Diagnosis not present

## 2021-06-08 DIAGNOSIS — M48061 Spinal stenosis, lumbar region without neurogenic claudication: Secondary | ICD-10-CM | POA: Diagnosis not present

## 2021-06-08 DIAGNOSIS — M419 Scoliosis, unspecified: Secondary | ICD-10-CM | POA: Diagnosis not present

## 2021-06-08 DIAGNOSIS — H9311 Tinnitus, right ear: Secondary | ICD-10-CM | POA: Diagnosis not present

## 2021-06-08 DIAGNOSIS — I951 Orthostatic hypotension: Secondary | ICD-10-CM | POA: Diagnosis not present

## 2021-06-08 DIAGNOSIS — K5732 Diverticulitis of large intestine without perforation or abscess without bleeding: Secondary | ICD-10-CM | POA: Diagnosis not present

## 2021-06-08 DIAGNOSIS — G4733 Obstructive sleep apnea (adult) (pediatric): Secondary | ICD-10-CM | POA: Diagnosis not present

## 2021-06-08 DIAGNOSIS — H269 Unspecified cataract: Secondary | ICD-10-CM | POA: Diagnosis not present

## 2021-06-19 DIAGNOSIS — D649 Anemia, unspecified: Secondary | ICD-10-CM | POA: Diagnosis not present

## 2021-06-19 DIAGNOSIS — M47812 Spondylosis without myelopathy or radiculopathy, cervical region: Secondary | ICD-10-CM | POA: Diagnosis not present

## 2021-06-19 DIAGNOSIS — H9191 Unspecified hearing loss, right ear: Secondary | ICD-10-CM | POA: Diagnosis not present

## 2021-06-19 DIAGNOSIS — K4090 Unilateral inguinal hernia, without obstruction or gangrene, not specified as recurrent: Secondary | ICD-10-CM | POA: Diagnosis not present

## 2021-06-19 DIAGNOSIS — I10 Essential (primary) hypertension: Secondary | ICD-10-CM | POA: Diagnosis not present

## 2021-06-19 DIAGNOSIS — I700 Atherosclerosis of aorta: Secondary | ICD-10-CM | POA: Diagnosis not present

## 2021-06-19 DIAGNOSIS — H3530 Unspecified macular degeneration: Secondary | ICD-10-CM | POA: Diagnosis not present

## 2021-06-19 DIAGNOSIS — K635 Polyp of colon: Secondary | ICD-10-CM | POA: Diagnosis not present

## 2021-06-19 DIAGNOSIS — M4802 Spinal stenosis, cervical region: Secondary | ICD-10-CM | POA: Diagnosis not present

## 2021-06-19 DIAGNOSIS — M48061 Spinal stenosis, lumbar region without neurogenic claudication: Secondary | ICD-10-CM | POA: Diagnosis not present

## 2021-06-19 DIAGNOSIS — E1136 Type 2 diabetes mellitus with diabetic cataract: Secondary | ICD-10-CM | POA: Diagnosis not present

## 2021-06-19 DIAGNOSIS — G4733 Obstructive sleep apnea (adult) (pediatric): Secondary | ICD-10-CM | POA: Diagnosis not present

## 2021-06-19 DIAGNOSIS — K5732 Diverticulitis of large intestine without perforation or abscess without bleeding: Secondary | ICD-10-CM | POA: Diagnosis not present

## 2021-06-19 DIAGNOSIS — H9311 Tinnitus, right ear: Secondary | ICD-10-CM | POA: Diagnosis not present

## 2021-06-19 DIAGNOSIS — K429 Umbilical hernia without obstruction or gangrene: Secondary | ICD-10-CM | POA: Diagnosis not present

## 2021-06-19 DIAGNOSIS — M5136 Other intervertebral disc degeneration, lumbar region: Secondary | ICD-10-CM | POA: Diagnosis not present

## 2021-06-19 DIAGNOSIS — I951 Orthostatic hypotension: Secondary | ICD-10-CM | POA: Diagnosis not present

## 2021-06-19 DIAGNOSIS — E1140 Type 2 diabetes mellitus with diabetic neuropathy, unspecified: Secondary | ICD-10-CM | POA: Diagnosis not present

## 2021-06-19 DIAGNOSIS — M419 Scoliosis, unspecified: Secondary | ICD-10-CM | POA: Diagnosis not present

## 2021-06-19 DIAGNOSIS — E7800 Pure hypercholesterolemia, unspecified: Secondary | ICD-10-CM | POA: Diagnosis not present

## 2021-06-19 DIAGNOSIS — M5416 Radiculopathy, lumbar region: Secondary | ICD-10-CM | POA: Diagnosis not present

## 2021-06-19 DIAGNOSIS — H269 Unspecified cataract: Secondary | ICD-10-CM | POA: Diagnosis not present

## 2021-06-23 DIAGNOSIS — M5416 Radiculopathy, lumbar region: Secondary | ICD-10-CM | POA: Diagnosis not present

## 2021-06-23 DIAGNOSIS — I951 Orthostatic hypotension: Secondary | ICD-10-CM | POA: Diagnosis not present

## 2021-06-23 DIAGNOSIS — M4802 Spinal stenosis, cervical region: Secondary | ICD-10-CM | POA: Diagnosis not present

## 2021-06-23 DIAGNOSIS — K429 Umbilical hernia without obstruction or gangrene: Secondary | ICD-10-CM | POA: Diagnosis not present

## 2021-06-23 DIAGNOSIS — E7800 Pure hypercholesterolemia, unspecified: Secondary | ICD-10-CM | POA: Diagnosis not present

## 2021-06-23 DIAGNOSIS — K635 Polyp of colon: Secondary | ICD-10-CM | POA: Diagnosis not present

## 2021-06-23 DIAGNOSIS — G4733 Obstructive sleep apnea (adult) (pediatric): Secondary | ICD-10-CM | POA: Diagnosis not present

## 2021-06-23 DIAGNOSIS — D649 Anemia, unspecified: Secondary | ICD-10-CM | POA: Diagnosis not present

## 2021-06-23 DIAGNOSIS — E1140 Type 2 diabetes mellitus with diabetic neuropathy, unspecified: Secondary | ICD-10-CM | POA: Diagnosis not present

## 2021-06-23 DIAGNOSIS — M48061 Spinal stenosis, lumbar region without neurogenic claudication: Secondary | ICD-10-CM | POA: Diagnosis not present

## 2021-06-23 DIAGNOSIS — K4090 Unilateral inguinal hernia, without obstruction or gangrene, not specified as recurrent: Secondary | ICD-10-CM | POA: Diagnosis not present

## 2021-06-23 DIAGNOSIS — H269 Unspecified cataract: Secondary | ICD-10-CM | POA: Diagnosis not present

## 2021-06-23 DIAGNOSIS — I700 Atherosclerosis of aorta: Secondary | ICD-10-CM | POA: Diagnosis not present

## 2021-06-23 DIAGNOSIS — M419 Scoliosis, unspecified: Secondary | ICD-10-CM | POA: Diagnosis not present

## 2021-06-23 DIAGNOSIS — H3530 Unspecified macular degeneration: Secondary | ICD-10-CM | POA: Diagnosis not present

## 2021-06-23 DIAGNOSIS — M47812 Spondylosis without myelopathy or radiculopathy, cervical region: Secondary | ICD-10-CM | POA: Diagnosis not present

## 2021-06-23 DIAGNOSIS — H9311 Tinnitus, right ear: Secondary | ICD-10-CM | POA: Diagnosis not present

## 2021-06-23 DIAGNOSIS — I10 Essential (primary) hypertension: Secondary | ICD-10-CM | POA: Diagnosis not present

## 2021-06-23 DIAGNOSIS — K5732 Diverticulitis of large intestine without perforation or abscess without bleeding: Secondary | ICD-10-CM | POA: Diagnosis not present

## 2021-06-23 DIAGNOSIS — M5136 Other intervertebral disc degeneration, lumbar region: Secondary | ICD-10-CM | POA: Diagnosis not present

## 2021-06-23 DIAGNOSIS — E1136 Type 2 diabetes mellitus with diabetic cataract: Secondary | ICD-10-CM | POA: Diagnosis not present

## 2021-06-23 DIAGNOSIS — H9191 Unspecified hearing loss, right ear: Secondary | ICD-10-CM | POA: Diagnosis not present

## 2021-06-29 DIAGNOSIS — K439 Ventral hernia without obstruction or gangrene: Secondary | ICD-10-CM | POA: Diagnosis not present

## 2021-07-01 DIAGNOSIS — M48061 Spinal stenosis, lumbar region without neurogenic claudication: Secondary | ICD-10-CM | POA: Diagnosis not present

## 2021-07-01 DIAGNOSIS — I10 Essential (primary) hypertension: Secondary | ICD-10-CM | POA: Diagnosis not present

## 2021-07-01 DIAGNOSIS — H3530 Unspecified macular degeneration: Secondary | ICD-10-CM | POA: Diagnosis not present

## 2021-07-01 DIAGNOSIS — E7800 Pure hypercholesterolemia, unspecified: Secondary | ICD-10-CM | POA: Diagnosis not present

## 2021-07-01 DIAGNOSIS — E1136 Type 2 diabetes mellitus with diabetic cataract: Secondary | ICD-10-CM | POA: Diagnosis not present

## 2021-07-01 DIAGNOSIS — M419 Scoliosis, unspecified: Secondary | ICD-10-CM | POA: Diagnosis not present

## 2021-07-01 DIAGNOSIS — G4733 Obstructive sleep apnea (adult) (pediatric): Secondary | ICD-10-CM | POA: Diagnosis not present

## 2021-07-01 DIAGNOSIS — K5732 Diverticulitis of large intestine without perforation or abscess without bleeding: Secondary | ICD-10-CM | POA: Diagnosis not present

## 2021-07-01 DIAGNOSIS — K4090 Unilateral inguinal hernia, without obstruction or gangrene, not specified as recurrent: Secondary | ICD-10-CM | POA: Diagnosis not present

## 2021-07-01 DIAGNOSIS — E1140 Type 2 diabetes mellitus with diabetic neuropathy, unspecified: Secondary | ICD-10-CM | POA: Diagnosis not present

## 2021-07-01 DIAGNOSIS — M47812 Spondylosis without myelopathy or radiculopathy, cervical region: Secondary | ICD-10-CM | POA: Diagnosis not present

## 2021-07-01 DIAGNOSIS — M5416 Radiculopathy, lumbar region: Secondary | ICD-10-CM | POA: Diagnosis not present

## 2021-07-01 DIAGNOSIS — M4802 Spinal stenosis, cervical region: Secondary | ICD-10-CM | POA: Diagnosis not present

## 2021-07-01 DIAGNOSIS — H9311 Tinnitus, right ear: Secondary | ICD-10-CM | POA: Diagnosis not present

## 2021-07-01 DIAGNOSIS — K635 Polyp of colon: Secondary | ICD-10-CM | POA: Diagnosis not present

## 2021-07-01 DIAGNOSIS — I700 Atherosclerosis of aorta: Secondary | ICD-10-CM | POA: Diagnosis not present

## 2021-07-01 DIAGNOSIS — H269 Unspecified cataract: Secondary | ICD-10-CM | POA: Diagnosis not present

## 2021-07-01 DIAGNOSIS — M5136 Other intervertebral disc degeneration, lumbar region: Secondary | ICD-10-CM | POA: Diagnosis not present

## 2021-07-01 DIAGNOSIS — H9191 Unspecified hearing loss, right ear: Secondary | ICD-10-CM | POA: Diagnosis not present

## 2021-07-01 DIAGNOSIS — D649 Anemia, unspecified: Secondary | ICD-10-CM | POA: Diagnosis not present

## 2021-07-01 DIAGNOSIS — I951 Orthostatic hypotension: Secondary | ICD-10-CM | POA: Diagnosis not present

## 2021-07-01 DIAGNOSIS — K429 Umbilical hernia without obstruction or gangrene: Secondary | ICD-10-CM | POA: Diagnosis not present

## 2021-07-01 DIAGNOSIS — R1902 Left upper quadrant abdominal swelling, mass and lump: Secondary | ICD-10-CM | POA: Diagnosis not present

## 2021-07-03 DIAGNOSIS — K760 Fatty (change of) liver, not elsewhere classified: Secondary | ICD-10-CM | POA: Diagnosis not present

## 2021-07-03 DIAGNOSIS — R1900 Intra-abdominal and pelvic swelling, mass and lump, unspecified site: Secondary | ICD-10-CM | POA: Diagnosis not present

## 2021-07-03 DIAGNOSIS — N281 Cyst of kidney, acquired: Secondary | ICD-10-CM | POA: Diagnosis not present

## 2021-07-03 DIAGNOSIS — R1902 Left upper quadrant abdominal swelling, mass and lump: Secondary | ICD-10-CM | POA: Diagnosis not present

## 2021-07-07 DIAGNOSIS — E1140 Type 2 diabetes mellitus with diabetic neuropathy, unspecified: Secondary | ICD-10-CM | POA: Diagnosis not present

## 2021-07-07 DIAGNOSIS — I951 Orthostatic hypotension: Secondary | ICD-10-CM | POA: Diagnosis not present

## 2021-07-07 DIAGNOSIS — M47812 Spondylosis without myelopathy or radiculopathy, cervical region: Secondary | ICD-10-CM | POA: Diagnosis not present

## 2021-07-07 DIAGNOSIS — K5732 Diverticulitis of large intestine without perforation or abscess without bleeding: Secondary | ICD-10-CM | POA: Diagnosis not present

## 2021-07-07 DIAGNOSIS — R269 Unspecified abnormalities of gait and mobility: Secondary | ICD-10-CM | POA: Diagnosis not present

## 2021-07-07 DIAGNOSIS — H9191 Unspecified hearing loss, right ear: Secondary | ICD-10-CM | POA: Diagnosis not present

## 2021-07-07 DIAGNOSIS — M5416 Radiculopathy, lumbar region: Secondary | ICD-10-CM | POA: Diagnosis not present

## 2021-07-07 DIAGNOSIS — K635 Polyp of colon: Secondary | ICD-10-CM | POA: Diagnosis not present

## 2021-07-07 DIAGNOSIS — E7800 Pure hypercholesterolemia, unspecified: Secondary | ICD-10-CM | POA: Diagnosis not present

## 2021-07-07 DIAGNOSIS — M419 Scoliosis, unspecified: Secondary | ICD-10-CM | POA: Diagnosis not present

## 2021-07-07 DIAGNOSIS — H269 Unspecified cataract: Secondary | ICD-10-CM | POA: Diagnosis not present

## 2021-07-07 DIAGNOSIS — E1136 Type 2 diabetes mellitus with diabetic cataract: Secondary | ICD-10-CM | POA: Diagnosis not present

## 2021-07-07 DIAGNOSIS — H9311 Tinnitus, right ear: Secondary | ICD-10-CM | POA: Diagnosis not present

## 2021-07-07 DIAGNOSIS — D649 Anemia, unspecified: Secondary | ICD-10-CM | POA: Diagnosis not present

## 2021-07-07 DIAGNOSIS — K4090 Unilateral inguinal hernia, without obstruction or gangrene, not specified as recurrent: Secondary | ICD-10-CM | POA: Diagnosis not present

## 2021-07-07 DIAGNOSIS — K429 Umbilical hernia without obstruction or gangrene: Secondary | ICD-10-CM | POA: Diagnosis not present

## 2021-07-07 DIAGNOSIS — H61303 Acquired stenosis of external ear canal, unspecified, bilateral: Secondary | ICD-10-CM | POA: Diagnosis not present

## 2021-07-07 DIAGNOSIS — M5136 Other intervertebral disc degeneration, lumbar region: Secondary | ICD-10-CM | POA: Diagnosis not present

## 2021-07-07 DIAGNOSIS — M48061 Spinal stenosis, lumbar region without neurogenic claudication: Secondary | ICD-10-CM | POA: Diagnosis not present

## 2021-07-07 DIAGNOSIS — H6123 Impacted cerumen, bilateral: Secondary | ICD-10-CM | POA: Diagnosis not present

## 2021-07-07 DIAGNOSIS — I700 Atherosclerosis of aorta: Secondary | ICD-10-CM | POA: Diagnosis not present

## 2021-07-07 DIAGNOSIS — I10 Essential (primary) hypertension: Secondary | ICD-10-CM | POA: Diagnosis not present

## 2021-07-07 DIAGNOSIS — M4802 Spinal stenosis, cervical region: Secondary | ICD-10-CM | POA: Diagnosis not present

## 2021-07-07 DIAGNOSIS — G4733 Obstructive sleep apnea (adult) (pediatric): Secondary | ICD-10-CM | POA: Diagnosis not present

## 2021-07-07 DIAGNOSIS — H3530 Unspecified macular degeneration: Secondary | ICD-10-CM | POA: Diagnosis not present

## 2021-07-08 DIAGNOSIS — D171 Benign lipomatous neoplasm of skin and subcutaneous tissue of trunk: Secondary | ICD-10-CM | POA: Diagnosis not present

## 2021-07-29 ENCOUNTER — Other Ambulatory Visit: Payer: Self-pay

## 2021-07-29 ENCOUNTER — Ambulatory Visit: Payer: Medicare Other | Admitting: Sports Medicine

## 2021-07-29 ENCOUNTER — Encounter: Payer: Self-pay | Admitting: Sports Medicine

## 2021-07-29 DIAGNOSIS — M79675 Pain in left toe(s): Secondary | ICD-10-CM | POA: Diagnosis not present

## 2021-07-29 DIAGNOSIS — M19072 Primary osteoarthritis, left ankle and foot: Secondary | ICD-10-CM

## 2021-07-29 DIAGNOSIS — M2042 Other hammer toe(s) (acquired), left foot: Secondary | ICD-10-CM

## 2021-07-29 DIAGNOSIS — I739 Peripheral vascular disease, unspecified: Secondary | ICD-10-CM

## 2021-07-29 DIAGNOSIS — B351 Tinea unguium: Secondary | ICD-10-CM

## 2021-07-29 DIAGNOSIS — M79674 Pain in right toe(s): Secondary | ICD-10-CM | POA: Diagnosis not present

## 2021-07-29 NOTE — Progress Notes (Signed)
Subjective: [name] is a 87 y.o. male patient who follow up evaluation of Left foot pain and for nail trim. Patient reports ocassional pain to left 2nd toe but pads helps.  Patient denies any other pedal complaints.   Patient Active Problem List   Diagnosis Date Noted   H/O total ankle replacement, left 03/14/2018   Hammertoe of left foot 12/20/2016   Metatarsalgia, left foot 12/20/2016   Plantar fat pad atrophy of left foot 12/20/2016   Acquired pes planus of both feet 08/26/2016   Dislocation of left knee with lateral meniscus tear 06/03/2016   Ankle arthritis 05/06/2016   Ingrowing nail 01/28/2016   Ingrown nail of great toe of left foot 01/28/2016   History of arthroscopy of knee 03/31/2015   S/P left knee arthroscopy 03/31/2015   Knee torn cartilage 03/13/2015   Tear of meniscus of left knee 03/13/2015   Gonalgia 02/24/2015   Ptosis, both eyelids 11/13/2014   Spinal stenosis in cervical region 10/10/2013   Disease of spinal cord ([place]) 10/10/2013   Cervical spondylosis with myelopathy 10/10/2013   Degeneration of intervertebral disc of lumbosacral region 04/28/2013    Current Outpatient Medications on File Prior to Visit  Medication Sig Dispense Refill   aspirin EC 81 MG tablet Take 1 tablet (81 mg total) by mouth 2 (two) times daily. 84 tablet 0   atorvastatin (LIPITOR) 10 MG tablet Take 10 mg by mouth at bedtime.      losartan (COZAAR) 100 MG tablet Take 100 mg by mouth at bedtime.     metoprolol (LOPRESSOR) 100 MG tablet Take 100 mg by mouth 2 (two) times daily.     Multiple Vitamin (MULTIVITAMIN WITH MINERALS) TABS Take 1 tablet by mouth daily.     tamsulosin (FLOMAX) 0.4 MG CAPS capsule SMARTSIG:1 Capsule(s) By Mouth Every Evening     No current facility-administered medications on file prior to visit.    No Known Allergies  Objective:  General: Alert and oriented x3 in no acute distress  Dermatology: No open lesions bilateral lower extremities, no webspace  macerations, no ecchymosis bilateral, all nails x 10 are thick and elongated consistent with onychomycosis. Old scar at left ankle.  Vascular: Dorsalis Pedis and Posterior Tibial pedal pulses 1/4, Capillary Fill Time 5 seconds,(+) pedal hair growth bilateral, 1+ edema bilateral lower extremities, Temperature gradient within normal limits.  Neurology: Johney Maine sensation intact via light touch bilateral.   Musculoskeletal: Rigid left 2nd hammertoe with Mild tenderness with palpation at PIPJ, + bunion deformity noted bilateral. No pain with calf compression bilateral.  Strength within normal limits in all groups bilateral for patient age with limited ROM to all joints. History of TAR on left and ankle fracture on right.         Assessment and Plan: Problem List Items Addressed This Visit       Musculoskeletal and Integument   Hammertoe of left foot   Other Visit Diagnoses     Pain due to onychomycosis of toenails of both feet    -  Primary   PVD (peripheral vascular disease) ([place])       Toe pain, left       Arthritis of left foot          -Complete examination performed -Re-Discussed treatement options -Continue with Buddin hammertoe regulator -Recommend good supportive shoes that do not rub toe -Mechanically debrided nails x 10 using sterile nail nipper without incident  -Recommend elevation and use of compression sock for swelling  and to discuss with PCP about meds for edema control -Patient to return to office in 3 months or sooner if condition worsens.  Landis Martins, DPM

## 2021-08-06 DIAGNOSIS — Z23 Encounter for immunization: Secondary | ICD-10-CM | POA: Diagnosis not present

## 2021-08-06 DIAGNOSIS — J410 Simple chronic bronchitis: Secondary | ICD-10-CM | POA: Diagnosis not present

## 2021-08-06 DIAGNOSIS — R1084 Generalized abdominal pain: Secondary | ICD-10-CM | POA: Diagnosis not present

## 2021-08-06 DIAGNOSIS — K5909 Other constipation: Secondary | ICD-10-CM | POA: Diagnosis not present

## 2021-08-13 ENCOUNTER — Other Ambulatory Visit: Payer: Self-pay

## 2021-08-13 ENCOUNTER — Ambulatory Visit: Payer: Medicare Other | Admitting: Neurology

## 2021-08-13 ENCOUNTER — Encounter: Payer: Self-pay | Admitting: Neurology

## 2021-08-13 VITALS — BP 142/87 | HR 72 | Ht 67.0 in | Wt 185.5 lb

## 2021-08-13 DIAGNOSIS — I639 Cerebral infarction, unspecified: Secondary | ICD-10-CM | POA: Insufficient documentation

## 2021-08-13 DIAGNOSIS — H532 Diplopia: Secondary | ICD-10-CM

## 2021-08-13 DIAGNOSIS — G3184 Mild cognitive impairment, so stated: Secondary | ICD-10-CM

## 2021-08-13 HISTORY — DX: Cerebral infarction, unspecified: I63.9

## 2021-08-13 HISTORY — DX: Mild cognitive impairment of uncertain or unknown etiology: G31.84

## 2021-08-13 HISTORY — DX: Diplopia: H53.2

## 2021-08-13 NOTE — Progress Notes (Signed)
Chief Complaint  Patient presents with   New Patient (Initial Visit)    Room 22 w/ daughter, [name]. Referral placed for hospital follow up for stroke-like symptoms. He went for treatment due to having the following symptoms: dizziness, off balance, excessive sweating, nausea. Says stroke was ruled out. Hx of previous CVA in 10/01/1978. We received a second referral from his optometrist with concerns of Myasthenia Gravis.      ASSESSMENT AND PLAN  [name] is a 87 y.o. male  Intermittent binocular diplopia, left static ptosis  Acetylcholine receptor antibody to rule out neuromuscular junctional disorder such as myasthenia gravis  He denies bulbar weakness, no limb muscle weakness  History of stroke  Vascular risk factor of aging, hypertension, hyperlipidemia,  On aspirin 81 mg daily  Mild cognitive impairment  MoCA examination 22/30,  Laboratory evaluation showed no treatable etiology  Likely vascular component with history of stroke, moderate small vessel disease  Gait abnormality  Combination of bilateral ankle issues, lumbar radiculopathy, daughter also reported a history of cervical myelopathy, but not a surgical candidate, residual deficit from previous stroke,  Continue moderate exercise    DIAGNOSTIC DATA (LABS, IMAGING, TESTING) - I reviewed patient records, labs, notes, testing and imaging myself where available. MRI of brain in June 2022: No acute or subacute finding. Atrophy and chronic small-vessel ischemic changes. Old right posterior frontal cortical and subcortical infarction. Changes are slightly progressive since 2016.  US carotid artery in June 2022:  1. Mild (1-49%) stenosis proximal right internal carotid artery secondary to mild right-sided heterogeneous atherosclerotic plaque. 2. No significant atherosclerotic plaque or evidence of stenosis in the left internal carotid artery. 3. Vertebral arteries are patent with normal antegrade flow.  CTA  of head and neck 1. Negative for large vessel occlusion.  2. Moderate atherosclerotic stenosis of the right vertebral artery origin. But no other significant posterior circulation stenosis.  3. A moderate stenosis of the right MCA M1 stenosis is new since 2009 and might correlate with the previous right MCA infarct. But otherwise right greater than left cervical carotid atherosclerosis with no other significant anterior circulation stenosis.  4. Aortic Atherosclerosis (ICD10-I70.0) with chronic mild aneurysmal enlargement (40 mm) of the distal arch and proximal descending aorta - stable from a 2016 Chest CTA.  MEDICAL HISTORY:  [name], is a 87 year old male, seen in request by his primary care physician Dr. Nelda Bucks E for evaluation of gait abnormality, memory loss, 1 episode of dizziness, initial evaluation was with his daughter on August 13, 2021    I reviewed and summarized the referring note. PMHx. HTN Prostate hypertrophy HLD. 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, was noted to have mild memory loss over the past few years, MoCA examination 22/30 today, has difficulty with delayed recall  He was admitted to [facility] on May 06, 2021, woke up from overnight sleep, felt sweaty, lightheaded, nausea, dizziness, but denied vertigo, nervous, scared, he called 911,  at hospital blood pressure was elevated, documented to be  198/99, EKG reviewed sinus rhythm,  Personally MRI of the brain showed no acute abnormality, evidence of atrophy, chronic small vessel disease, old right posterior frontal cortical subcortical infarction  CT angiogram of head and neck showed no significant large vessel disease, intracranial atherosclerotic disease  Laboratory evaluations, normal CBC, hemoglobin of 13.4, CMP, creatinine of 0.9, normal TSH 1.95, A1c 5.6 B12 542, lipid profile LDL 47  But even today, he complains dizziness, denies vertigo, on further questioning, he described as mental cloudiness, likely related to his short-term memory loss,  He also complains 6 months history of intermittent double vision, seems to be binocular, mild droopy eyelid on the left side, and a history of bilateral cataract surgery, blepharoplasty in the past, was seen by ophthalmologist, referred to neurology to rule out myasthenia gravis, he denies bulbar weakness, denies dysarthria, there was no new onset limb muscle weakness  PHYSICAL EXAM:   Vitals:   08/13/21 1531  BP: (!) 142/87  Pulse: 72  Weight: 185 lb 8 oz (84.1 kg)  Height: 5\' 7"  (1.702 m)   Not recorded     Body mass index is 29.05 kg/m.  PHYSICAL EXAMNIATION:  Gen: NAD, conversant, well nourised, well groomed                     Cardiovascular: Regular rate rhythm, no peripheral edema, warm, nontender. Eyes: Conjunctivae clear without exudates or hemorrhage Neck: Supple, no carotid bruits. Pulmonary: Clear to auscultation bilaterally   NEUROLOGICAL EXAM:  MENTAL STATUS: Speech:    Speech is normal; fluent and spontaneous with normal comprehension.  Cognition:     Montreal Cognitive Assessment  08/13/2021  Visuospatial/ Executive (0/5) 2  Naming (0/3) 3  Attention: Read list of digits (0/2) 2  Attention: Read list of letters (0/1) 1  Attention: Serial 7 subtraction starting at 100 (0/3) 2  Language: Repeat phrase (0/2) 2  Language : Fluency (0/1) 1  Abstraction  (0/2) 2  Delayed Recall (0/5) 1  Orientation (0/6) 6  Total 22    CRANIAL NERVES: CN II: Visual fields are full to confrontation. Pupils are round equal and briskly reactive to light. CN III, IV, VI: extraocular movement are normal.  Left static ptosis CN V: Facial sensation is intact to light touch CN VII: Face is symmetric with normal eye closure  CN VIII: Hearing is normal to causal conversation. CN IX, X: Phonation is normal. CN XI: Head turning and shoulder shrug are intact  MOTOR: Fixation of left arm upon rapid rotating movement, mild left lower extremity drift, deformity of bilateral ankle, left ankle pitting edema  REFLEXES: Hyper-reflexia of left upper extremity, brisk reflex at bilateral patella, left worse than right, absent ankle reflex,  SENSORY: Length dependent decreased light touch, pinprick, vibratory sensation to knee level  COORDINATION: There is no trunk or limb dysmetria noted.  GAIT/STANCE: He needs push-up to get up from seated position, right ankle pointing outwards, right pelvic thrust, dragging left leg some, positive Romberg signs  REVIEW OF SYSTEMS:  Full 14 system review of systems performed and notable only for as above All other review of systems were negative.   ALLERGIES: No Known Allergies  HOME MEDICATIONS: Current Outpatient Medications  Medication Sig Dispense Refill   ANORO ELLIPTA 62.5-25 MCG/INH AEPB Inhale 1 puff into the lungs daily.     aspirin EC 81 MG tablet Take 1 tablet (81 mg total) by mouth 2 (two) times  daily. 84 tablet 0   atorvastatin (LIPITOR) 10 MG tablet Take 10 mg by mouth at bedtime.      losartan (COZAAR) 100 MG tablet Take 100 mg by mouth at bedtime.     melatonin 5 MG TABS Take 5 mg by mouth at bedtime.     metoprolol (LOPRESSOR) 100 MG tablet Take 100 mg by mouth 2 (two) times daily.     Multiple Vitamin (MULTIVITAMIN WITH MINERALS) TABS Take 1 tablet by mouth daily.     pantoprazole (PROTONIX) 40 MG tablet Take  40 mg by mouth daily.     polyethylene glycol (MIRALAX / GLYCOLAX) 17 g packet Take 1 packet by mouth 2 (two) times daily.     tamsulosin (FLOMAX) 0.4 MG CAPS capsule SMARTSIG:1 Capsule(s) By Mouth Every Evening     No current facility-administered medications for this visit.    PAST MEDICAL HISTORY: Past Medical History:  Diagnosis Date   Acid reflux    Arthritis    "right ankle" (05/11/2013)   Chronic lower back pain    Enlarged prostate    Hard of hearing    History of kidney stones    Hypercholesteremia    Hypertension    Sleep apnea    "had study; then OR; never wore mask" (05/11/2013)   Stroke [facility]) 1979   "fully recovered since then" (05/11/2013)   Vertigo    present intermittently for 40+ years    PAST SURGICAL HISTORY: Past Surgical History:  Procedure Laterality Date   ANKLE FUSION Right 05/10/2013   Procedure: ARTHRODESIS ANKLE;  Surgeon: Wylene Simmer, MD;  Location: [place];  Service: Orthopedics;  Laterality: Right;   BACK SURGERY  11/22/2009   CARPAL TUNNEL RELEASE Right 04/15/2010   LITHOTRIPSY  ~ 2009   NASAL SINUS SURGERY Bilateral 11/22/2002   POSTERIOR LAMINECTOMY / DECOMPRESSION LUMBAR SPINE  11/22/2009   Dr. Shellia Carwin   REPAIR OF PERFORATED ULCER  11/22/1977   STOMACH SURGERY     TENDON RELEASE Right 05/10/2013   Procedure: PERCUTANEOUS ACHILLES LENGTHENING;  Surgeon: Wylene Simmer, MD;  Location: [place];  Service: Orthopedics;  Laterality: Right;   TONSILLECTOMY  07/24/1939   TOTAL ANKLE ARTHROPLASTY Left 03/14/2018   Procedure: TOTAL ANKLE ARTHOPLASTY;  Surgeon: Wylene Simmer, MD;  Location: [place];  Service: Orthopedics;  Laterality: Left;    FAMILY HISTORY: Family History  Problem Relation Age of Onset   Hypertension Mother    Heart attack Mother    Hypertension Father    COPD Father     SOCIAL HISTORY: Social History   Socioeconomic History   Marital status: Widowed    Spouse name: Not on file   Number of children: 2   Years of education:  22   Highest education level: High school graduate  Occupational History   Not on file  Tobacco Use   Smoking status: Former    Packs/day: 1.00    Years: 25.00    Pack years: 25.00    Types: Cigarettes    Quit date: 05/09/1979    Years since quitting: 42.2   Smokeless tobacco: Former    Types: Chew    Quit date: 11/22/1988  Vaping Use   Vaping Use: Never used  Substance and Sexual Activity   Alcohol use: Yes    Alcohol/week: 5.0 standard drinks    Types: 5 Cans of beer per week    Comment: 5 beers per week   Drug use: Never   Sexual activity: Not Currently  Other  Topics Concern   Not on file  Social History Narrative   Lives at home alone.   Right-handed.   No daily use of caffeine.    Social Determinants of Health   Financial Resource Strain: Not on file  Food Insecurity: Not on file  Transportation Needs: Not on file  Physical Activity: Not on file  Stress: Not on file  Social Connections: Not on file  Intimate Partner Violence: Not on file      Marcial Pacas, M.D. Ph.D.  [facility] [address] [place], [place] [address] Ph: [phone] Fax: [phone]  CC:  Nicoletta Dress, MD [place],  [place] [address]  Nicoletta Dress, MD

## 2021-08-19 LAB — ACETYLCHOLINE RECEPTOR AB, ALL
AChR Binding Ab, Serum: <0.03 nmol/L (ref 0.00–0.24)
Acetylchol Block Ab: 14 % (ref 0–25)

## 2021-08-28 DIAGNOSIS — M5416 Radiculopathy, lumbar region: Secondary | ICD-10-CM | POA: Diagnosis not present

## 2021-09-02 DIAGNOSIS — H5231 Anisometropia: Secondary | ICD-10-CM | POA: Diagnosis not present

## 2021-09-28 DIAGNOSIS — Z8711 Personal history of peptic ulcer disease: Secondary | ICD-10-CM | POA: Diagnosis not present

## 2021-09-28 DIAGNOSIS — R1012 Left upper quadrant pain: Secondary | ICD-10-CM | POA: Diagnosis not present

## 2021-09-28 DIAGNOSIS — K5790 Diverticulosis of intestine, part unspecified, without perforation or abscess without bleeding: Secondary | ICD-10-CM | POA: Diagnosis not present

## 2021-10-28 ENCOUNTER — Ambulatory Visit: Payer: Medicare Other | Admitting: Sports Medicine

## 2021-10-28 ENCOUNTER — Encounter: Payer: Self-pay | Admitting: Sports Medicine

## 2021-10-28 DIAGNOSIS — M79675 Pain in left toe(s): Secondary | ICD-10-CM

## 2021-10-28 DIAGNOSIS — M79674 Pain in right toe(s): Secondary | ICD-10-CM | POA: Diagnosis not present

## 2021-10-28 DIAGNOSIS — I739 Peripheral vascular disease, unspecified: Secondary | ICD-10-CM | POA: Diagnosis not present

## 2021-10-28 DIAGNOSIS — B351 Tinea unguium: Secondary | ICD-10-CM

## 2021-10-28 NOTE — Progress Notes (Signed)
Subjective: [name] is a 87 y.o. male patient who follow up evaluation of Left foot pain and for nail trim. Patient reports still has issues with swelling L>R and ? If he should see the ortho doctor again who did his TAR on left to check it.   Patient denies any other pedal complaints.   Patient Active Problem List   Diagnosis Date Noted   Diplopia 08/13/2021   Cerebrovascular accident (CVA) ([place]) 08/13/2021   Mild cognitive impairment 08/13/2021   H/O total ankle replacement, left 03/14/2018   Hammertoe of left foot 12/20/2016   Metatarsalgia, left foot 12/20/2016   Plantar fat pad atrophy of left foot 12/20/2016   Acquired pes planus of both feet 08/26/2016   Dislocation of left knee with lateral meniscus tear 06/03/2016   Ankle arthritis 05/06/2016   Ingrowing nail 01/28/2016   Ingrown nail of great toe of left foot 01/28/2016   History of arthroscopy of knee 03/31/2015   S/P left knee arthroscopy 03/31/2015   Knee torn cartilage 03/13/2015   Tear of meniscus of left knee 03/13/2015   Gonalgia 02/24/2015   Ptosis, both eyelids 11/13/2014   Spinal stenosis in cervical region 10/10/2013   Disease of spinal cord ([place]) 10/10/2013   Cervical spondylosis with myelopathy 10/10/2013   Degeneration of intervertebral disc of lumbosacral region 04/28/2013    Current Outpatient Medications on File Prior to Visit  Medication Sig Dispense Refill   ANORO ELLIPTA 62.5-25 MCG/INH AEPB Inhale 1 puff into the lungs daily.     aspirin EC 81 MG tablet Take 1 tablet (81 mg total) by mouth 2 (two) times daily. 84 tablet 0   atorvastatin (LIPITOR) 10 MG tablet Take 10 mg by mouth at bedtime.      losartan (COZAAR) 100 MG tablet Take 100 mg by mouth at bedtime.     melatonin 5 MG TABS Take 5 mg by mouth at bedtime.     metoprolol (LOPRESSOR) 100 MG tablet Take 100 mg by mouth 2 (two) times daily.     Multiple Vitamin (MULTIVITAMIN WITH MINERALS) TABS Take 1 tablet by mouth daily.      pantoprazole (PROTONIX) 40 MG tablet Take 40 mg by mouth daily.     polyethylene glycol (MIRALAX / GLYCOLAX) 17 g packet Take 1 packet by mouth 2 (two) times daily.     tamsulosin (FLOMAX) 0.4 MG CAPS capsule SMARTSIG:1 Capsule(s) By Mouth Every Evening     No current facility-administered medications on file prior to visit.    No Known Allergies  Objective:  General: Alert and oriented x3 in no acute distress  Dermatology: No open lesions bilateral lower extremities, no webspace macerations, no ecchymosis bilateral, all nails x 10 are thick and elongated consistent with onychomycosis. Old scar at left>right ankle.  Vascular: Dorsalis Pedis and Posterior Tibial pedal pulses 1/4, Capillary Fill Time 5 seconds,(+) pedal hair growth bilateral, 1+ edema bilateral lower extremities worse on left no pain with compression, Temperature gradient within normal limits.  Neurology: Johney Maine sensation intact via light touch bilateral.   Musculoskeletal: Rigid left>right 2nd hammertoe, + bunion deformity noted bilateral. History of TAR on left and ankle fracture on right.         Assessment and Plan: Problem List Items Addressed This Visit   None Visit Diagnoses     Pain due to onychomycosis of toenails of both feet    -  Primary   PVD (peripheral vascular disease) ([place])          -  Complete examination performed -Mechanically debrided nails x 10 using sterile nail nipper without incident  -Recommend elevation and advised patient to re-discuss swelling with PCP or see ortho if he is concerned about his TAR.  -Patient to return to office in 3 months or sooner if condition worsens.  Landis Martins, DPM

## 2021-11-19 DIAGNOSIS — Z86718 Personal history of other venous thrombosis and embolism: Secondary | ICD-10-CM | POA: Diagnosis not present

## 2021-11-19 DIAGNOSIS — R600 Localized edema: Secondary | ICD-10-CM | POA: Diagnosis not present

## 2021-11-19 DIAGNOSIS — M79605 Pain in left leg: Secondary | ICD-10-CM | POA: Diagnosis not present

## 2021-11-19 DIAGNOSIS — K219 Gastro-esophageal reflux disease without esophagitis: Secondary | ICD-10-CM | POA: Diagnosis not present

## 2021-11-19 DIAGNOSIS — M7989 Other specified soft tissue disorders: Secondary | ICD-10-CM | POA: Diagnosis not present

## 2021-11-19 DIAGNOSIS — M21372 Foot drop, left foot: Secondary | ICD-10-CM | POA: Diagnosis not present

## 2021-11-19 DIAGNOSIS — E785 Hyperlipidemia, unspecified: Secondary | ICD-10-CM | POA: Diagnosis not present

## 2021-11-19 DIAGNOSIS — R7301 Impaired fasting glucose: Secondary | ICD-10-CM | POA: Diagnosis not present

## 2021-11-19 DIAGNOSIS — I10 Essential (primary) hypertension: Secondary | ICD-10-CM | POA: Diagnosis not present

## 2022-01-08 DIAGNOSIS — Z0000 Encounter for general adult medical examination without abnormal findings: Secondary | ICD-10-CM | POA: Diagnosis not present

## 2022-01-08 DIAGNOSIS — Z139 Encounter for screening, unspecified: Secondary | ICD-10-CM | POA: Diagnosis not present

## 2022-01-08 DIAGNOSIS — E785 Hyperlipidemia, unspecified: Secondary | ICD-10-CM | POA: Diagnosis not present

## 2022-01-08 DIAGNOSIS — Z9181 History of falling: Secondary | ICD-10-CM | POA: Diagnosis not present

## 2022-01-25 DIAGNOSIS — H61303 Acquired stenosis of external ear canal, unspecified, bilateral: Secondary | ICD-10-CM | POA: Diagnosis not present

## 2022-01-25 DIAGNOSIS — H6123 Impacted cerumen, bilateral: Secondary | ICD-10-CM | POA: Diagnosis not present

## 2022-01-27 ENCOUNTER — Encounter: Payer: Self-pay | Admitting: Sports Medicine

## 2022-01-27 ENCOUNTER — Other Ambulatory Visit: Payer: Self-pay

## 2022-01-27 ENCOUNTER — Ambulatory Visit: Payer: Medicare Other | Admitting: Sports Medicine

## 2022-01-27 DIAGNOSIS — B351 Tinea unguium: Secondary | ICD-10-CM

## 2022-01-27 DIAGNOSIS — M79675 Pain in left toe(s): Secondary | ICD-10-CM | POA: Diagnosis not present

## 2022-01-27 DIAGNOSIS — I739 Peripheral vascular disease, unspecified: Secondary | ICD-10-CM

## 2022-01-27 DIAGNOSIS — M79674 Pain in right toe(s): Secondary | ICD-10-CM

## 2022-01-27 NOTE — Progress Notes (Signed)
Subjective: ?[name] is a 88 y.o. male patient who returns to office for follow up evaluation for nail trim. Patient reports that swelling is about the same worse on the left because of history of TAR.  Denies any other pedal complaints at this time. ? ?PCP visit Nicoletta Dress, MD December 2022 ? ?Patient Active Problem List  ? Diagnosis Date Noted  ? Diplopia 08/13/2021  ? Cerebrovascular accident (CVA) ([place]) 08/13/2021  ? Mild cognitive impairment 08/13/2021  ? H/O total ankle replacement, left 03/14/2018  ? Hammertoe of left foot 12/20/2016  ? Metatarsalgia, left foot 12/20/2016  ? Plantar fat pad atrophy of left foot 12/20/2016  ? Acquired pes planus of both feet 08/26/2016  ? Dislocation of left knee with lateral meniscus tear 06/03/2016  ? Ankle arthritis 05/06/2016  ? Ingrowing nail 01/28/2016  ? Ingrown nail of great toe of left foot 01/28/2016  ? History of arthroscopy of knee 03/31/2015  ? S/P left knee arthroscopy 03/31/2015  ? Knee torn cartilage 03/13/2015  ? Tear of meniscus of left knee 03/13/2015  ? Gonalgia 02/24/2015  ? Ptosis, both eyelids 11/13/2014  ? Spinal stenosis in cervical region 10/10/2013  ? Disease of spinal cord ([place]) 10/10/2013  ? Cervical spondylosis with myelopathy 10/10/2013  ? Degeneration of intervertebral disc of lumbosacral region 04/28/2013  ? ? ?Current Outpatient Medications on File Prior to Visit  ?Medication Sig Dispense Refill  ? ANORO ELLIPTA 62.5-25 MCG/INH AEPB Inhale 1 puff into the lungs daily.    ? aspirin EC 81 MG tablet Take 1 tablet (81 mg total) by mouth 2 (two) times daily. 84 tablet 0  ? atorvastatin (LIPITOR) 10 MG tablet Take 10 mg by mouth at bedtime.     ? losartan (COZAAR) 100 MG tablet Take 100 mg by mouth at bedtime.    ? melatonin 5 MG TABS Take 5 mg by mouth at bedtime.    ? metoprolol (LOPRESSOR) 100 MG tablet Take 100 mg by mouth 2 (two) times daily.    ? MODERNA COVID-19 BIVAL BOOSTER 50 MCG/0.5ML injection     ? Multiple Vitamin  (MULTIVITAMIN WITH MINERALS) TABS Take 1 tablet by mouth daily.    ? pantoprazole (PROTONIX) 40 MG tablet Take 40 mg by mouth daily.    ? polyethylene glycol (MIRALAX / GLYCOLAX) 17 g packet Take 1 packet by mouth 2 (two) times daily.    ? tamsulosin (FLOMAX) 0.4 MG CAPS capsule SMARTSIG:1 Capsule(s) By Mouth Every Evening    ? ?No current facility-administered medications on file prior to visit.  ? ? ?No Known Allergies ? ?Objective:  ?General: Alert and oriented x3 in no acute distress ? ?Dermatology: No open lesions bilateral lower extremities, no webspace macerations, no ecchymosis bilateral, all nails x 10 are thick and elongated consistent with onychomycosis. Old scar at left>right ankle history of total ankle replacement. ? ?Vascular: Dorsalis Pedis and Posterior Tibial pedal pulses 1/4, Capillary Fill Time 5 seconds,(+) pedal hair growth bilateral, 1+ edema bilateral lower extremities worse on left with history of previous surgery, temperature gradient within normal limits. ? ?Neurology: Gross sensation intact via light touch bilateral.  ? ?Musculoskeletal: Rigid left>right 2nd hammertoe, + bunion deformity noted bilateral. History of TAR on left and ankle fracture on right, no current pain to these areas.   ?      ?Assessment and Plan: ?Problem List Items Addressed This Visit   ?None ?Visit Diagnoses   ? ? Pain due to onychomycosis of toenails of  both feet    -  Primary  ? Relevant Medications  ? MODERNA COVID-19 BIVAL BOOSTER 50 MCG/0.5ML injection  ? PVD (peripheral vascular disease) ([place])      ? ?  ? ?-Complete examination performed ?-Mechanically debrided all painful nails x 10 using sterile nail nipper without incident  ?-Recommend elevation and advised elevation and compression for edema control ?-Advised good supportive shoes daily for foot type that do not rub hammertoes ?-Patient to return to office in 3 months or sooner if condition worsens. ? ?Landis Martins, DPM ? ?

## 2022-02-11 DIAGNOSIS — G8929 Other chronic pain: Secondary | ICD-10-CM | POA: Diagnosis not present

## 2022-02-11 DIAGNOSIS — R109 Unspecified abdominal pain: Secondary | ICD-10-CM | POA: Diagnosis not present

## 2022-02-15 DIAGNOSIS — N200 Calculus of kidney: Secondary | ICD-10-CM | POA: Diagnosis not present

## 2022-02-15 DIAGNOSIS — R109 Unspecified abdominal pain: Secondary | ICD-10-CM | POA: Diagnosis not present

## 2022-02-15 DIAGNOSIS — K7689 Other specified diseases of liver: Secondary | ICD-10-CM | POA: Diagnosis not present

## 2022-02-15 DIAGNOSIS — K5730 Diverticulosis of large intestine without perforation or abscess without bleeding: Secondary | ICD-10-CM | POA: Diagnosis not present

## 2022-02-15 DIAGNOSIS — N281 Cyst of kidney, acquired: Secondary | ICD-10-CM | POA: Diagnosis not present

## 2022-02-24 DIAGNOSIS — M5416 Radiculopathy, lumbar region: Secondary | ICD-10-CM | POA: Diagnosis not present

## 2022-03-03 DIAGNOSIS — H353131 Nonexudative age-related macular degeneration, bilateral, early dry stage: Secondary | ICD-10-CM | POA: Diagnosis not present

## 2022-04-22 ENCOUNTER — Encounter: Payer: Self-pay | Admitting: Podiatry

## 2022-04-22 ENCOUNTER — Ambulatory Visit: Payer: Medicare Other | Admitting: Podiatry

## 2022-04-22 DIAGNOSIS — B351 Tinea unguium: Secondary | ICD-10-CM

## 2022-04-22 DIAGNOSIS — M79675 Pain in left toe(s): Secondary | ICD-10-CM | POA: Diagnosis not present

## 2022-04-22 DIAGNOSIS — I739 Peripheral vascular disease, unspecified: Secondary | ICD-10-CM

## 2022-04-22 DIAGNOSIS — M79674 Pain in right toe(s): Secondary | ICD-10-CM

## 2022-04-27 NOTE — Progress Notes (Signed)
  Subjective:  Patient ID: [name], male    DOB: [birth year],  MRN: [id number]  DEZ STAUFFER presents to clinic today for for at risk foot care. Patient has h/o PAD and painful elongated mycotic toenails 1-5 bilaterally which are tender when wearing enclosed shoe gear. Pain is relieved with periodic professional debridement.  New problem(s): None.   PCP is Nicoletta Dress, MD , and last visit was January 08, 2022.  No Known Allergies  Review of Systems: Negative except as noted in the HPI.  Objective: No changes noted in today's physical examination. Vascular Examination: CFT <4 seconds b/l. DP/PT pulses faintly palpable b/l. Digital hair absent. Skin temperature gradient warm to warm b/l. No ischemia or gangrene. No cyanosis or clubbing noted b/l. +1 pitting edema noted BLE.   Neurological Examination: Sensation grossly intact b/l with 10 gram monofilament. Vibratory sensation intact b/l.   Dermatological Examination: Surgical scar noted L>R with h/o total ankle replacement. Pedal skin is warm and supple b/l LE. No open wounds b/l LE. No interdigital macerations noted b/l LE. Toenails 1-5 bilaterally elongated, discolored, dystrophic, thickened, and crumbly with subungual debris and tenderness to dorsal palpation.  Musculoskeletal Examination: Muscle strength 5/5 to b/l LE.  History of total ankle replacement LLE; ORIF ankle fx RLE. HAV with bunion deformity noted b/l LE. Severe hammertoe deformity noted 2nd toe L>R. Utilizes cane for ambulation assistance.  Radiographs: None  Assessment/Plan: 1. Pain due to onychomycosis of toenails of both feet   2. PVD (peripheral vascular disease) ([place])   -Patient was evaluated and treated. All patient's and/or POA's questions/concerns answered on today's visit. -Patient to continue soft, supportive shoe gear daily. -Toenails 1-5 b/l were debrided in length and girth with sterile nail nippers and dremel without iatrogenic bleeding.   -Patient/POA to call should there be question/concern in the interim.   Return in about 3 months (around 07/23/2022).  Marzetta Board, DPM

## 2022-05-06 ENCOUNTER — Ambulatory Visit: Payer: Medicare Other | Admitting: Podiatry

## 2022-05-19 DIAGNOSIS — E785 Hyperlipidemia, unspecified: Secondary | ICD-10-CM | POA: Diagnosis not present

## 2022-05-19 DIAGNOSIS — M21372 Foot drop, left foot: Secondary | ICD-10-CM | POA: Diagnosis not present

## 2022-05-19 DIAGNOSIS — I10 Essential (primary) hypertension: Secondary | ICD-10-CM | POA: Diagnosis not present

## 2022-05-19 DIAGNOSIS — R600 Localized edema: Secondary | ICD-10-CM | POA: Diagnosis not present

## 2022-05-19 DIAGNOSIS — Z86718 Personal history of other venous thrombosis and embolism: Secondary | ICD-10-CM | POA: Diagnosis not present

## 2022-05-19 DIAGNOSIS — M25552 Pain in left hip: Secondary | ICD-10-CM | POA: Diagnosis not present

## 2022-05-19 DIAGNOSIS — R7301 Impaired fasting glucose: Secondary | ICD-10-CM | POA: Diagnosis not present

## 2022-06-17 DIAGNOSIS — M5416 Radiculopathy, lumbar region: Secondary | ICD-10-CM

## 2022-06-17 HISTORY — DX: Radiculopathy, lumbar region: M54.16

## 2022-06-21 DIAGNOSIS — E785 Hyperlipidemia, unspecified: Secondary | ICD-10-CM | POA: Diagnosis not present

## 2022-06-21 DIAGNOSIS — I10 Essential (primary) hypertension: Secondary | ICD-10-CM | POA: Diagnosis not present

## 2022-06-28 DIAGNOSIS — M545 Low back pain, unspecified: Secondary | ICD-10-CM | POA: Diagnosis not present

## 2022-06-28 DIAGNOSIS — R2689 Other abnormalities of gait and mobility: Secondary | ICD-10-CM | POA: Diagnosis not present

## 2022-06-30 DIAGNOSIS — R2689 Other abnormalities of gait and mobility: Secondary | ICD-10-CM | POA: Diagnosis not present

## 2022-06-30 DIAGNOSIS — M545 Low back pain, unspecified: Secondary | ICD-10-CM | POA: Diagnosis not present

## 2022-07-06 DIAGNOSIS — M545 Low back pain, unspecified: Secondary | ICD-10-CM | POA: Diagnosis not present

## 2022-07-06 DIAGNOSIS — R2689 Other abnormalities of gait and mobility: Secondary | ICD-10-CM | POA: Diagnosis not present

## 2022-07-08 DIAGNOSIS — M545 Low back pain, unspecified: Secondary | ICD-10-CM | POA: Diagnosis not present

## 2022-07-08 DIAGNOSIS — R2689 Other abnormalities of gait and mobility: Secondary | ICD-10-CM | POA: Diagnosis not present

## 2022-07-22 ENCOUNTER — Ambulatory Visit: Payer: Medicare Other | Admitting: Podiatry

## 2022-07-27 DIAGNOSIS — H61303 Acquired stenosis of external ear canal, unspecified, bilateral: Secondary | ICD-10-CM | POA: Diagnosis not present

## 2022-07-27 DIAGNOSIS — H6123 Impacted cerumen, bilateral: Secondary | ICD-10-CM | POA: Diagnosis not present

## 2022-07-29 DIAGNOSIS — K6289 Other specified diseases of anus and rectum: Secondary | ICD-10-CM | POA: Diagnosis not present

## 2022-07-29 DIAGNOSIS — D171 Benign lipomatous neoplasm of skin and subcutaneous tissue of trunk: Secondary | ICD-10-CM | POA: Diagnosis not present

## 2022-08-10 ENCOUNTER — Ambulatory Visit: Payer: Medicare Other | Admitting: Podiatry

## 2022-08-10 DIAGNOSIS — B351 Tinea unguium: Secondary | ICD-10-CM

## 2022-08-10 DIAGNOSIS — M79675 Pain in left toe(s): Secondary | ICD-10-CM | POA: Diagnosis not present

## 2022-08-10 DIAGNOSIS — I739 Peripheral vascular disease, unspecified: Secondary | ICD-10-CM

## 2022-08-10 DIAGNOSIS — M79674 Pain in right toe(s): Secondary | ICD-10-CM

## 2022-08-10 DIAGNOSIS — M2042 Other hammer toe(s) (acquired), left foot: Secondary | ICD-10-CM

## 2022-08-10 NOTE — Progress Notes (Unsigned)
  Subjective:  Patient ID: [name], male    DOB: 1933-06-09,  MRN: [id number]  Chief Complaint  Patient presents with   Nail Problem    Routine foot care     88 y.o. male presents with the above complaint. History confirmed with patient. ***  Objective:  Physical Exam: warm, good capillary refill, nail exam {:315758}, no trophic changes or ulcerative lesions. {pod dm exam:23670::"DP pulses palpable","PT pulses palpable","protective sensation intact"} Left Foot: {exam; foot:5774::"normal exam, no swelling, tenderness, instability; ligaments intact, full range of motion of all ankle/foot joints"}  Right Foot: {exam; foot:5774::"normal exam, no swelling, tenderness, instability; ligaments intact, full range of motion of all ankle/foot joints"}   No images are attached to the encounter.  Assessment:   1. Pain due to onychomycosis of toenails of both feet   2. PVD (peripheral vascular disease) ([place])   3. Hammertoe of left foot      Plan:  Patient was evaluated and treated and all questions answered.  Onychomycosis with pain  -Nails palliatively debrided as below. -Educated on self-care  Procedure: Nail Debridement Rationale: Pain Type of Debridement: manual, sharp debridement. Instrumentation: Nail nipper, rotary burr. Number of Nails: 10  Return in about 3 months (around 11/09/2022) for RFC.         Everitt Amber, DPM [facility] / [facility]

## 2022-08-25 DIAGNOSIS — I440 Atrioventricular block, first degree: Secondary | ICD-10-CM | POA: Diagnosis not present

## 2022-08-25 DIAGNOSIS — Z743 Need for continuous supervision: Secondary | ICD-10-CM | POA: Diagnosis not present

## 2022-08-25 DIAGNOSIS — Z79899 Other long term (current) drug therapy: Secondary | ICD-10-CM | POA: Diagnosis not present

## 2022-08-25 DIAGNOSIS — I10 Essential (primary) hypertension: Secondary | ICD-10-CM | POA: Diagnosis not present

## 2022-08-25 DIAGNOSIS — R110 Nausea: Secondary | ICD-10-CM | POA: Diagnosis not present

## 2022-08-25 DIAGNOSIS — Z86711 Personal history of pulmonary embolism: Secondary | ICD-10-CM | POA: Diagnosis not present

## 2022-08-25 DIAGNOSIS — Z7982 Long term (current) use of aspirin: Secondary | ICD-10-CM | POA: Diagnosis not present

## 2022-08-25 DIAGNOSIS — Z23 Encounter for immunization: Secondary | ICD-10-CM | POA: Diagnosis not present

## 2022-08-25 DIAGNOSIS — R42 Dizziness and giddiness: Secondary | ICD-10-CM | POA: Diagnosis not present

## 2022-08-25 DIAGNOSIS — J9811 Atelectasis: Secondary | ICD-10-CM | POA: Diagnosis not present

## 2022-08-25 DIAGNOSIS — H8110 Benign paroxysmal vertigo, unspecified ear: Secondary | ICD-10-CM | POA: Diagnosis not present

## 2022-08-25 DIAGNOSIS — R112 Nausea with vomiting, unspecified: Secondary | ICD-10-CM | POA: Diagnosis not present

## 2022-08-25 DIAGNOSIS — Z8673 Personal history of transient ischemic attack (TIA), and cerebral infarction without residual deficits: Secondary | ICD-10-CM | POA: Diagnosis not present

## 2022-08-25 DIAGNOSIS — R231 Pallor: Secondary | ICD-10-CM | POA: Diagnosis not present

## 2022-08-26 DIAGNOSIS — H8110 Benign paroxysmal vertigo, unspecified ear: Secondary | ICD-10-CM | POA: Diagnosis not present

## 2022-08-26 DIAGNOSIS — Z86711 Personal history of pulmonary embolism: Secondary | ICD-10-CM | POA: Diagnosis not present

## 2022-08-26 DIAGNOSIS — R42 Dizziness and giddiness: Secondary | ICD-10-CM | POA: Diagnosis not present

## 2022-08-26 DIAGNOSIS — R112 Nausea with vomiting, unspecified: Secondary | ICD-10-CM | POA: Diagnosis not present

## 2022-09-02 DIAGNOSIS — I10 Essential (primary) hypertension: Secondary | ICD-10-CM | POA: Diagnosis not present

## 2022-09-02 DIAGNOSIS — H8113 Benign paroxysmal vertigo, bilateral: Secondary | ICD-10-CM | POA: Diagnosis not present

## 2022-09-02 DIAGNOSIS — R001 Bradycardia, unspecified: Secondary | ICD-10-CM | POA: Diagnosis not present

## 2022-09-16 ENCOUNTER — Telehealth: Payer: Self-pay

## 2022-09-16 NOTE — Patient Outreach (Signed)
  Care Coordination   09/16/2022 Name: [name] MRN: [id number] DOB: 11-Mar-1933   Care Coordination Outreach Attempts:  An unsuccessful telephone outreach was attempted today to offer the patient information about available care coordination services as a benefit of their health plan.   Follow Up Plan:  Additional outreach attempts will be made to offer the patient care coordination information and services.   Encounter Outcome:  No Answer  Care Coordination Interventions Activated:  No   Care Coordination Interventions:  No, not indicated    Tomasa Rand, RN, BSN, CEN [facility] [employer] [phone]

## 2022-09-22 ENCOUNTER — Telehealth: Payer: Self-pay

## 2022-09-22 NOTE — Patient Outreach (Signed)
  Care Coordination   Initial Visit Note   09/22/2022 Name: [name] MRN: [id number] DOB: December 25, 1932  [name] is a 88 y.o. year old male who sees Nicoletta Dress, MD for primary care. I spoke with  [name] by phone today.  What matters to the patients health and wellness today?  Placed call to patient today and reviewed [facility] care coordination program.  Patient gives verbally consent for a Education officer, museum. Patient reports that he will not be able to live alone much longer.  Reports that he will need assistance at home or assisted living.  Patient also reports difficulty with meals. Reports that he uses drive thru lines because of his problems with mobility. Reports the problem is the food is usually a hot dog, hamburger or chicken.     Goals Addressed               This Visit's Progress     COMPLETED: I need help with food and planning where to live in the future (pt-stated)        Care Coordination Interventions: Social Work referral for need for meals and planning for future with caregiver or assistive living.          SDOH assessments and interventions completed:  No     Care Coordination Interventions Activated:  Yes  Care Coordination Interventions:  Yes, provided   Follow up plan: Referral made to social worker    Encounter Outcome:  Pt. Visit Completed  Tomasa Rand, RN, BSN, CEN [place] Coordinator [phone]

## 2022-09-23 ENCOUNTER — Telehealth: Payer: Self-pay | Admitting: *Deleted

## 2022-09-23 NOTE — Chronic Care Management (AMB) (Signed)
  Care Coordination Note  09/23/2022 Name: [name] MRN: [id number] DOB: 05-14-1933  [name] is a 88 y.o. year old male who is a primary care patient of Nicoletta Dress, MD and is actively engaged with the care management team. I reached out to Rema Jasmine by phone today to assist with scheduling an initial visit with the Licensed Clinical Social Worker  Follow up plan: Telephone appointment with care management team member scheduled for:09/24/2022  Julian Hy, [place] Direct Dial: [phone]

## 2022-09-23 NOTE — Telephone Encounter (Signed)
   Telephone encounter was:  Successful.  09/23/2022 Name: LADARREN STEINER MRN: [id number] DOB: 11/16/1933  ZAKI GERTSCH is a 88 y.o. year old male who is a primary care patient of Nicoletta Dress, MD . The community resource team was consulted for assistance with [place] guide performed the following interventions: Patient provided with information about care guide support team and interviewed to confirm resource needs. patient returned phone call , had no interest in food resources , says money and food not the issue looking for some one to help out at the house well informed on agency and regional consolidated services as well other options , talked but patient seemed to have all the information he needed and was looking forward to seeing LCSW     Follow Up Plan:  No further follow up planned at this time. The patient has been provided with needed resources.  [place] [phone] [address] , [place] [address] Email : [email or web address]

## 2022-09-23 NOTE — Telephone Encounter (Signed)
   Telephone encounter was:  Unsuccessful.  09/23/2022 Name: HUEY SCALIA MRN: [id number] DOB: 1933/07/26  Unsuccessful outbound call made today to assist with:  Food Insecurity  Outreach Attempt:  1st Attempt  A HIPAA compliant voice message was left requesting a return call.  Instructed patient to call back at [phone].  [place] [phone] [address] , [place] [address] Email : [email or web address]

## 2022-09-24 ENCOUNTER — Encounter: Payer: Medicare Other | Admitting: *Deleted

## 2022-09-24 ENCOUNTER — Ambulatory Visit: Payer: Self-pay | Admitting: Licensed Clinical Social Worker

## 2022-09-24 NOTE — Patient Instructions (Signed)
Visit Information  Thank you for taking time to visit with me today. Please don't hesitate to contact me if I can be of assistance to you.   Following are the goals we discussed today:   Goals Addressed               This Visit's Progress     Care Coordination Activities- Food (pt-stated)        Patient stated he is in need of meals services. Patient stated he often eats fast food because he is unable to cook or sit in resturant. SW placed [facility] referral for Meals on Wheels. Patient denied needing any additional services.            Patient verbalizes understanding of instructions and care plan provided today and agrees to view in [place]. Active MyChart status and patient understanding of how to access instructions and care plan via MyChart confirmed with patient.     No further follow up required: .  Milus Height, Arita Miss , MSW, [place] Social Worker [facility] Care Management  [phone]

## 2022-09-24 NOTE — Patient Outreach (Signed)
  Care Coordination   Initial Visit Note   09/24/2022 Name: OLYN LANDSTROM MRN: [id number] DOB: 07-Apr-1933  JAMONE GARRIDO is a 88 y.o. year old male who sees Nicoletta Dress, MD for primary care. I spoke with  Rema Jasmine by phone today.  What matters to the patients health and wellness today?  Food Insecurity    Goals Addressed               This Visit's Progress     Care Coordination Activities- Food (pt-stated)        Patient stated he is in need of meals services. Patient stated he often eats fast food because he is unable to cook or sit in resturant. SW placed [place]360 referral for Meals on Wheels. Patient denied needing any additional services.          SDOH assessments and interventions completed:  Yes     Care Coordination Interventions Activated:  Yes  Care Coordination Interventions:  Yes, provided   Follow up plan: No further intervention required.   Encounter Outcome:  Pt. Visit Completed

## 2022-11-04 ENCOUNTER — Ambulatory Visit: Payer: Medicare Other | Admitting: Podiatry

## 2022-11-04 VITALS — BP 141/78 | HR 65

## 2022-11-04 DIAGNOSIS — M79674 Pain in right toe(s): Secondary | ICD-10-CM

## 2022-11-04 DIAGNOSIS — I739 Peripheral vascular disease, unspecified: Secondary | ICD-10-CM | POA: Diagnosis not present

## 2022-11-04 DIAGNOSIS — M79675 Pain in left toe(s): Secondary | ICD-10-CM

## 2022-11-04 DIAGNOSIS — B351 Tinea unguium: Secondary | ICD-10-CM | POA: Diagnosis not present

## 2022-11-04 NOTE — Progress Notes (Signed)
  Subjective:  Patient ID: [name], male    DOB: 05/18/1933,  MRN: [id number]  TORRENCE HAMMACK presents to clinic today for at risk foot care. Patient has h/o PAD and painful thick toenails that are difficult to trim. Pain interferes with ambulation. Aggravating factors include wearing enclosed shoe gear. Pain is relieved with periodic professional debridement.  Chief Complaint  Patient presents with   Nail Problem    Routine Foot Care   PCP- Nelda Bucks, MD Last Visit- October 2023   New problem(s): None.   PCP is Nicoletta Dress, MD.  No Known Allergies  Review of Systems: Negative except as noted in the HPI.  Objective: No changes noted in today's physical examination. Vitals:   11/04/22 1614  BP: (!) 141/78  Pulse: Philo is a pleasant 88 y.o. male WD, WN in NAD. AAO x 3.  Vascular Examination: CFT <4 seconds b/l. DP/PT pulses faintly palpable b/l. Digital hair absent. Skin temperature gradient warm to warm b/l. No ischemia or gangrene. No cyanosis or clubbing noted b/l. +1 pitting edema noted BLE.   Neurological Examination: Sensation grossly intact b/l with 10 gram monofilament. Vibratory sensation intact b/l.   Dermatological Examination: Surgical scar noted L>R with h/o total ankle replacement. Pedal skin is warm and supple b/l LE. No open wounds b/l LE. No interdigital macerations noted b/l LE.   Toenails 1-5 bilaterally elongated, discolored, dystrophic, thickened, and crumbly with subungual debris and tenderness to dorsal palpation.  Musculoskeletal Examination: Muscle strength 5/5 to b/l LE. History of total ankle replacement LLE; ORIF ankle fx RLE. HAV with bunion deformity noted b/l LE. Severe hammertoe deformity noted 2nd toe L>R. Utilizes cane for ambulation assistance.  Radiographs: None  Assessment/Plan: 1. Pain due to onychomycosis of toenails of both feet   2. PVD (peripheral vascular disease) ([place])     No orders of the  defined types were placed in this encounter.  -Consent given for treatment as described below: -Examined patient. -Patient to continue soft, supportive shoe gear daily. -Mycotic toenails 1-5 bilaterally were debrided in length and girth with sterile nail nippers and dremel without incident. -Patient/POA to call should there be question/concern in the interim.   Return in about 3 months (around 02/03/2023).  Marzetta Board, DPM

## 2022-11-07 ENCOUNTER — Encounter: Payer: Self-pay | Admitting: Podiatry

## 2022-11-18 DIAGNOSIS — R600 Localized edema: Secondary | ICD-10-CM | POA: Diagnosis not present

## 2022-11-18 DIAGNOSIS — R7301 Impaired fasting glucose: Secondary | ICD-10-CM | POA: Diagnosis not present

## 2022-11-18 DIAGNOSIS — I10 Essential (primary) hypertension: Secondary | ICD-10-CM | POA: Diagnosis not present

## 2022-11-18 DIAGNOSIS — Z86718 Personal history of other venous thrombosis and embolism: Secondary | ICD-10-CM | POA: Diagnosis not present

## 2022-11-18 DIAGNOSIS — M21372 Foot drop, left foot: Secondary | ICD-10-CM | POA: Diagnosis not present

## 2022-11-18 DIAGNOSIS — M25552 Pain in left hip: Secondary | ICD-10-CM | POA: Diagnosis not present

## 2022-11-18 DIAGNOSIS — E785 Hyperlipidemia, unspecified: Secondary | ICD-10-CM | POA: Diagnosis not present

## 2022-12-22 DIAGNOSIS — I10 Essential (primary) hypertension: Secondary | ICD-10-CM | POA: Diagnosis not present

## 2022-12-22 DIAGNOSIS — E785 Hyperlipidemia, unspecified: Secondary | ICD-10-CM | POA: Diagnosis not present

## 2023-01-20 DIAGNOSIS — E785 Hyperlipidemia, unspecified: Secondary | ICD-10-CM | POA: Diagnosis not present

## 2023-01-20 DIAGNOSIS — I10 Essential (primary) hypertension: Secondary | ICD-10-CM | POA: Diagnosis not present

## 2023-02-24 ENCOUNTER — Ambulatory Visit (INDEPENDENT_AMBULATORY_CARE_PROVIDER_SITE_OTHER): Payer: Medicare Other

## 2023-02-24 ENCOUNTER — Ambulatory Visit: Payer: Medicare Other | Admitting: Podiatry

## 2023-02-24 ENCOUNTER — Encounter: Payer: Self-pay | Admitting: Podiatry

## 2023-02-24 DIAGNOSIS — B351 Tinea unguium: Secondary | ICD-10-CM | POA: Diagnosis not present

## 2023-02-24 DIAGNOSIS — S90112A Contusion of left great toe without damage to nail, initial encounter: Secondary | ICD-10-CM

## 2023-02-24 DIAGNOSIS — T1490XA Injury, unspecified, initial encounter: Secondary | ICD-10-CM

## 2023-02-24 DIAGNOSIS — M79675 Pain in left toe(s): Secondary | ICD-10-CM | POA: Diagnosis not present

## 2023-02-24 DIAGNOSIS — M79674 Pain in right toe(s): Secondary | ICD-10-CM | POA: Diagnosis not present

## 2023-02-24 DIAGNOSIS — S99922A Unspecified injury of left foot, initial encounter: Secondary | ICD-10-CM

## 2023-02-24 DIAGNOSIS — I739 Peripheral vascular disease, unspecified: Secondary | ICD-10-CM

## 2023-02-24 NOTE — Progress Notes (Signed)
  Subjective:  Patient ID: [name], male    DOB: 31-May-1933,  MRN: [id number]  LINC FLAMENCO presents to clinic today for at risk foot care. Patient has h/o PAD and painful elongated mycotic toenails 1-5 bilaterally which are tender when wearing enclosed shoe gear. Pain is relieved with periodic professional debridement.  Chief Complaint  Patient presents with   routine foot care     Jammed left foot in shower, great hallux   New problem(s): with chief concern of injury to left great toe. Injury occurred three days ago. Injury recurred as a result of a mechanism of sliding down in the shower and jamming his left great toe up against the shower wall. Patient states aggravating factor(s) is/are none.  Patient has tried no treatment.  He states toe is bruised, but no longer painful.  PCP is Paulina Fusi, MD.  No Known Allergies  Review of Systems: Negative except as noted in the HPI.  Objective: No changes noted in today's physical examination. There were no vitals filed for this visit.  [name] is a pleasant 89 y.o. male WD, WN in NAD. AAO x 3.  Vascular Examination: CFT <4 seconds b/l. DP/PT pulses faintly palpable b/l. Digital hair absent. Skin temperature gradient warm to warm b/l. No ischemia or gangrene. No cyanosis or clubbing noted b/l. +1 pitting edema noted BLE.   Neurological Examination: Sensation grossly intact b/l with 10 gram monofilament. Vibratory sensation intact b/l.   Dermatological Examination: Ecchymosis noted dorsal aspect of left hallux proximal to 1st MPJ. No breaks in skin.  Surgical scar noted L>R with h/o total ankle replacement. Pedal skin is warm and supple b/l LE. No open wounds b/l LE. No interdigital macerations noted b/l LE.   Toenails 1-5 bilaterally elongated, discolored, dystrophic, thickened, and crumbly with subungual debris and tenderness to dorsal palpation.  Musculoskeletal Examination: No pain with passive nor active ROM.  Muscle strength 5/5 to b/l LE. History of total ankle replacement LLE; ORIF ankle fx RLE. HAV with bunion deformity noted b/l LE. Severe hammertoe deformity noted 2nd toe L>R. Utilizes cane for ambulation assistance.    Xray findings left foot: No gas in tissues left foot. Soft tissue swelling present left great toe. Questionable fx proximal phalangeal head plantarly. No foreign body evident left foot.  Assessment/Plan: 1. Pain due to onychomycosis of toenails of both feet   2. Toe injury, left, initial encounter   3. PVD (peripheral vascular disease)     -Patient was evaluated and treated. All patient's and/or POA's questions/concerns answered on today's visit. -Offered patient Darco shoe for contusion and he declined on today's visit. -Patient to continue soft, supportive shoe gear daily. -Toenails 1-5 b/l were debrided in length and girth with sterile nail nippers and dremel without iatrogenic bleeding.  -Xray of left foot was performed and reviewed with patient and/or POA. -Patient scheduled to see Dr. Annamary Rummage in 3 weeks for follow up of left great toe injury. -Patient/POA to call should there be question/concern in the interim.   Return in about 3 weeks (around 03/17/2023).  [name], DPM

## 2023-03-14 ENCOUNTER — Ambulatory Visit: Payer: Medicare Other | Admitting: Podiatry

## 2023-03-14 DIAGNOSIS — S99922D Unspecified injury of left foot, subsequent encounter: Secondary | ICD-10-CM | POA: Diagnosis not present

## 2023-03-14 DIAGNOSIS — S90112D Contusion of left great toe without damage to nail, subsequent encounter: Secondary | ICD-10-CM | POA: Diagnosis not present

## 2023-03-14 DIAGNOSIS — I739 Peripheral vascular disease, unspecified: Secondary | ICD-10-CM

## 2023-03-14 DIAGNOSIS — M2042 Other hammer toe(s) (acquired), left foot: Secondary | ICD-10-CM

## 2023-03-14 NOTE — Progress Notes (Signed)
  Subjective:  Patient ID: [name], male    DOB: May 02, 1933,  MRN: [id number]  Chief Complaint  Patient presents with   Follow-up    Follow up on left foot, patient stated that his pain is manageable    89 y.o. male presents for follow-up on left great toe pain.  He says that he has had decreased pain since he stubbed his toe a couple weeks ago.  He is walking in regular shoe without any issue at this time.  He also has a hammertoe on the left second toe.  Past Medical History:  Diagnosis Date   Acid reflux    Arthritis    "right ankle" (05/11/2013)   Chronic lower back pain    Enlarged prostate    Hard of hearing    History of kidney stones    Hypercholesteremia    Hypertension    Sleep apnea    "had study; then OR; never wore mask" (05/11/2013)   Stroke 1979   "fully recovered since then" (05/11/2013)   Vertigo    present intermittently for 40+ years    No Known Allergies  ROS: Negative except as per HPI above  Objective:  General: AAO x3, NAD  Dermatological: With inspection and palpation of the right and left lower extremities there are no open sores, no preulcerative lesions, no rash or signs of infection present. Nails are of normal length thickness and coloration.   Vascular:  Dorsalis Pedis artery and Posterior Tibial artery pedal pulses are 2/4 bilateral.  Capillary fill time < 3 sec to all digits.   Neruologic: Grossly intact via light touch bilateral. Protective threshold intact to all sites bilateral.   Musculoskeletal: Second toe hammertoe left foot with pain.  No pain on palpation of the left hallux.  No edema or erythema of the left hallux.  Gait: Unassisted, Nonantalgic.   No images are attached to the encounter.  Radiographs:  Deferred at this visit.  Reviewed prior radiographs did not see fracture in the left hallux Assessment:   1. Toe injury, left, subsequent encounter   2. Contusion of left great toe without damage to nail, subsequent  encounter   3. PVD (peripheral vascular disease)   4. Hammertoe of left foot      Plan:  Patient was evaluated and treated and all questions answered.  # Contusion of left hallux -Patient appears to have sustained a deep bone contusion of the left hallux when he jammed it a couple weeks ago. -No evidence of hallux fracture at this time.  Patient does not have pain -Continue to monitor  # Hammertoe of the left second toe -Recommend continued monitoring of this issue and use of a gel spacer or toe cap  Return for pt has apt in july for RFC.          Corinna Gab, DPM [facility] / [facility]

## 2023-03-16 DIAGNOSIS — H353132 Nonexudative age-related macular degeneration, bilateral, intermediate dry stage: Secondary | ICD-10-CM | POA: Diagnosis not present

## 2023-05-17 DIAGNOSIS — R600 Localized edema: Secondary | ICD-10-CM | POA: Diagnosis not present

## 2023-05-17 DIAGNOSIS — M21372 Foot drop, left foot: Secondary | ICD-10-CM | POA: Diagnosis not present

## 2023-05-17 DIAGNOSIS — D171 Benign lipomatous neoplasm of skin and subcutaneous tissue of trunk: Secondary | ICD-10-CM | POA: Diagnosis not present

## 2023-05-17 DIAGNOSIS — E785 Hyperlipidemia, unspecified: Secondary | ICD-10-CM | POA: Diagnosis not present

## 2023-05-17 DIAGNOSIS — I10 Essential (primary) hypertension: Secondary | ICD-10-CM | POA: Diagnosis not present

## 2023-05-17 DIAGNOSIS — Z86718 Personal history of other venous thrombosis and embolism: Secondary | ICD-10-CM | POA: Diagnosis not present

## 2023-05-17 DIAGNOSIS — R7301 Impaired fasting glucose: Secondary | ICD-10-CM | POA: Diagnosis not present

## 2023-05-18 ENCOUNTER — Ambulatory Visit: Payer: Medicare Other | Admitting: Podiatry

## 2023-05-24 ENCOUNTER — Ambulatory Visit: Payer: Medicare Other | Admitting: Podiatry

## 2023-05-24 DIAGNOSIS — M79674 Pain in right toe(s): Secondary | ICD-10-CM

## 2023-05-24 DIAGNOSIS — M79675 Pain in left toe(s): Secondary | ICD-10-CM | POA: Diagnosis not present

## 2023-05-24 DIAGNOSIS — I739 Peripheral vascular disease, unspecified: Secondary | ICD-10-CM | POA: Diagnosis not present

## 2023-05-24 DIAGNOSIS — B351 Tinea unguium: Secondary | ICD-10-CM | POA: Diagnosis not present

## 2023-05-24 NOTE — Progress Notes (Signed)
  Subjective:  Patient ID: KENDARIOUS DUBUQUE, male    DOB: 07-28-1933,  MRN: [id number]  Chief Complaint  Patient presents with   Nail Problem    Routine Foot Care- Nail trim     89 y.o. male presents with the above complaint. History confirmed with patient. Patient presenting with pain related to dystrophic thickened elongated nails. Patient is unable to trim own nails related to nail dystrophy and/or mobility issues. Patient does not have a history of T2DM.   Objective:  Physical Exam: warm, good capillary refill nail exam onychomycosis of the toenails, onycholysis, and dystrophic nails DP pulses palpable, PT pulses palpable, and protective sensation intact Edema of left lower extremity Left Foot:  Pain with palpation of nails due to elongation and dystrophic growth.  Right Foot: Pain with palpation of nails due to elongation and dystrophic growth.   Assessment:   1. Pain due to onychomycosis of toenails of both feet   2. PVD (peripheral vascular disease) ([facility])      Plan:  Patient was evaluated and treated and all questions answered.  #Onychomycosis with pain  -Nails palliatively debrided as below. -Educated on self-care  Procedure: Nail Debridement Rationale: Pain Type of Debridement: manual, sharp debridement. Instrumentation: Nail nipper, rotary burr. Number of Nails: 10  Return in about 3 months (around 08/24/2023) for RFC.         Corinna Gab, DPM [facility] / [facility]

## 2023-06-14 DIAGNOSIS — D171 Benign lipomatous neoplasm of skin and subcutaneous tissue of trunk: Secondary | ICD-10-CM | POA: Diagnosis not present

## 2023-06-16 ENCOUNTER — Encounter (HOSPITAL_COMMUNITY): Payer: Self-pay

## 2023-06-16 ENCOUNTER — Inpatient Hospital Stay (HOSPITAL_COMMUNITY)
Admission: AD | Admit: 2023-06-16 | Discharge: 2023-06-17 | DRG: 065 | Disposition: A | Discharge status: Home Health | Payer: Medicare Other | Source: Other Acute Inpatient Hospital | Attending: Internal Medicine | Admitting: Internal Medicine

## 2023-06-16 ENCOUNTER — Inpatient Hospital Stay (HOSPITAL_COMMUNITY): Payer: Medicare Other

## 2023-06-16 DIAGNOSIS — G8929 Other chronic pain: Secondary | ICD-10-CM | POA: Diagnosis present

## 2023-06-16 DIAGNOSIS — R9431 Abnormal electrocardiogram [ECG] [EKG]: Secondary | ICD-10-CM | POA: Diagnosis not present

## 2023-06-16 DIAGNOSIS — N400 Enlarged prostate without lower urinary tract symptoms: Secondary | ICD-10-CM | POA: Diagnosis present

## 2023-06-16 DIAGNOSIS — Z8249 Family history of ischemic heart disease and other diseases of the circulatory system: Secondary | ICD-10-CM | POA: Diagnosis not present

## 2023-06-16 DIAGNOSIS — G9389 Other specified disorders of brain: Secondary | ICD-10-CM | POA: Diagnosis not present

## 2023-06-16 DIAGNOSIS — Z9282 Status post administration of tPA (rtPA) in a different facility within the last 24 hours prior to admission to current facility: Secondary | ICD-10-CM | POA: Diagnosis not present

## 2023-06-16 DIAGNOSIS — I4891 Unspecified atrial fibrillation: Secondary | ICD-10-CM | POA: Diagnosis not present

## 2023-06-16 DIAGNOSIS — F17210 Nicotine dependence, cigarettes, uncomplicated: Secondary | ICD-10-CM | POA: Diagnosis not present

## 2023-06-16 DIAGNOSIS — R531 Weakness: Secondary | ICD-10-CM | POA: Diagnosis not present

## 2023-06-16 DIAGNOSIS — G4730 Sleep apnea, unspecified: Secondary | ICD-10-CM | POA: Diagnosis present

## 2023-06-16 DIAGNOSIS — I639 Cerebral infarction, unspecified: Secondary | ICD-10-CM | POA: Diagnosis present

## 2023-06-16 DIAGNOSIS — I6509 Occlusion and stenosis of unspecified vertebral artery: Secondary | ICD-10-CM | POA: Diagnosis not present

## 2023-06-16 DIAGNOSIS — I700 Atherosclerosis of aorta: Secondary | ICD-10-CM | POA: Diagnosis not present

## 2023-06-16 DIAGNOSIS — I444 Left anterior fascicular block: Secondary | ICD-10-CM | POA: Diagnosis not present

## 2023-06-16 DIAGNOSIS — Z79899 Other long term (current) drug therapy: Secondary | ICD-10-CM

## 2023-06-16 DIAGNOSIS — R569 Unspecified convulsions: Secondary | ICD-10-CM | POA: Diagnosis not present

## 2023-06-16 DIAGNOSIS — Z743 Need for continuous supervision: Secondary | ICD-10-CM | POA: Diagnosis not present

## 2023-06-16 DIAGNOSIS — K219 Gastro-esophageal reflux disease without esophagitis: Secondary | ICD-10-CM | POA: Diagnosis present

## 2023-06-16 DIAGNOSIS — I669 Occlusion and stenosis of unspecified cerebral artery: Secondary | ICD-10-CM | POA: Diagnosis not present

## 2023-06-16 DIAGNOSIS — I672 Cerebral atherosclerosis: Secondary | ICD-10-CM | POA: Diagnosis not present

## 2023-06-16 DIAGNOSIS — M545 Low back pain, unspecified: Secondary | ICD-10-CM | POA: Diagnosis present

## 2023-06-16 DIAGNOSIS — I6501 Occlusion and stenosis of right vertebral artery: Secondary | ICD-10-CM | POA: Diagnosis not present

## 2023-06-16 DIAGNOSIS — R739 Hyperglycemia, unspecified: Secondary | ICD-10-CM | POA: Diagnosis present

## 2023-06-16 DIAGNOSIS — R231 Pallor: Secondary | ICD-10-CM | POA: Diagnosis not present

## 2023-06-16 DIAGNOSIS — Z825 Family history of asthma and other chronic lower respiratory diseases: Secondary | ICD-10-CM | POA: Diagnosis not present

## 2023-06-16 DIAGNOSIS — Z8673 Personal history of transient ischemic attack (TIA), and cerebral infarction without residual deficits: Secondary | ICD-10-CM | POA: Diagnosis not present

## 2023-06-16 DIAGNOSIS — R9082 White matter disease, unspecified: Secondary | ICD-10-CM | POA: Diagnosis not present

## 2023-06-16 DIAGNOSIS — I6523 Occlusion and stenosis of bilateral carotid arteries: Secondary | ICD-10-CM | POA: Diagnosis not present

## 2023-06-16 DIAGNOSIS — E7800 Pure hypercholesterolemia, unspecified: Secondary | ICD-10-CM | POA: Diagnosis present

## 2023-06-16 DIAGNOSIS — R42 Dizziness and giddiness: Secondary | ICD-10-CM | POA: Diagnosis not present

## 2023-06-16 DIAGNOSIS — I10 Essential (primary) hypertension: Secondary | ICD-10-CM | POA: Diagnosis present

## 2023-06-16 DIAGNOSIS — I6389 Other cerebral infarction: Secondary | ICD-10-CM | POA: Diagnosis not present

## 2023-06-16 LAB — COMPREHENSIVE METABOLIC PANEL
ALT: 25 U/L (ref 0–44)
AST: 26 U/L (ref 15–41)
Albumin: 3.7 g/dL (ref 3.5–5.0)
Alkaline Phosphatase: 112 U/L (ref 38–126)
Anion gap: 15 (ref 5–15)
BUN: 17 mg/dL (ref 8–23)
CO2: 24 mmol/L (ref 22–32)
Calcium: 9.1 mg/dL (ref 8.9–10.3)
Chloride: 101 mmol/L (ref 98–111)
Creatinine, Ser: 0.91 mg/dL (ref 0.61–1.24)
GFR, Estimated: >60 mL/min (ref >60)
Glucose, Bld: 114 mg/dL — ABNORMAL HIGH (ref 70–99)
Potassium: 4.2 mmol/L (ref 3.5–5.1)
Sodium: 140 mmol/L (ref 135–145)
Total Bilirubin: 1.1 mg/dL (ref 0.3–1.2)
Total Protein: 6 g/dL — ABNORMAL LOW (ref 6.5–8.1)

## 2023-06-16 LAB — CBC
HCT: 40.8 % (ref 39.0–52.0)
Hemoglobin: 13.6 g/dL (ref 13.0–17.0)
MCH: 29.6 pg (ref 26.0–34.0)
MCHC: 33.3 g/dL (ref 30.0–36.0)
MCV: 88.9 fL (ref 80.0–100.0)
Platelets: 241 10*3/uL (ref 150–400)
RBC: 4.59 MIL/uL (ref 4.22–5.81)
RDW: 13 % (ref 11.5–15.5)
WBC: 10.7 10*3/uL — ABNORMAL HIGH (ref 4.0–10.5)
nRBC: 0 % (ref 0.0–0.2)

## 2023-06-16 LAB — LIPID PANEL
Cholesterol: 117 mg/dL (ref 0–200)
HDL: 42 mg/dL (ref >40)
LDL Cholesterol: 67 mg/dL (ref 0–99)
Total CHOL/HDL Ratio: 2.8 RATIO
Triglycerides: 41 mg/dL (ref <150)
VLDL: 8 mg/dL (ref 0–40)

## 2023-06-16 LAB — MRSA NEXT GEN BY PCR, NASAL: MRSA by PCR Next Gen: NOT DETECTED

## 2023-06-16 LAB — HEMOGLOBIN A1C
Hgb A1c MFr Bld: 5.9 % — ABNORMAL HIGH (ref 4.8–5.6)
Mean Plasma Glucose: 122.63 mg/dL

## 2023-06-16 MED ORDER — SODIUM CHLORIDE 0.9 % IV SOLN
12.5000 mg | Freq: Once | INTRAVENOUS | Status: AC
  Administered 2023-06-16: 12.5 mg via INTRAVENOUS
  Filled 2023-06-16: qty 12.5

## 2023-06-16 MED ORDER — ATORVASTATIN CALCIUM 10 MG PO TABS
10.0000 mg | ORAL_TABLET | Freq: Every day | ORAL | Status: DC
  Administered 2023-06-16: 10 mg via ORAL
  Filled 2023-06-16: qty 1

## 2023-06-16 MED ORDER — SENNOSIDES-DOCUSATE SODIUM 8.6-50 MG PO TABS: 1.0000 | ORAL_TABLET | Freq: Every evening | ORAL | Status: DC | PRN

## 2023-06-16 MED ORDER — STROKE: EARLY STAGES OF RECOVERY BOOK
Freq: Once | Status: AC
  Filled 2023-06-16: qty 1

## 2023-06-16 MED ORDER — POLYETHYLENE GLYCOL 3350 17 G PO PACK: 1.0000 | PACK | Freq: Two times a day (BID) | ORAL | Status: DC

## 2023-06-16 MED ORDER — ACETAMINOPHEN 650 MG RE SUPP: 650.0000 mg | RECTAL | Status: DC | PRN

## 2023-06-16 MED ORDER — TAMSULOSIN HCL 0.4 MG PO CAPS
0.4000 mg | ORAL_CAPSULE | Freq: Every day | ORAL | Status: DC
  Administered 2023-06-17: 0.4 mg via ORAL
  Filled 2023-06-16 (×2): qty 1

## 2023-06-16 MED ORDER — PANTOPRAZOLE SODIUM 40 MG IV SOLR
40.0000 mg | Freq: Every day | INTRAVENOUS | Status: DC
  Administered 2023-06-16: 40 mg via INTRAVENOUS
  Filled 2023-06-16: qty 10

## 2023-06-16 MED ORDER — CLEVIDIPINE BUTYRATE 0.5 MG/ML IV EMUL: 0.0000 mg/h | INTRAVENOUS | Status: DC

## 2023-06-16 MED ORDER — MELATONIN 5 MG PO TABS
5.0000 mg | ORAL_TABLET | Freq: Every day | ORAL | Status: DC
  Administered 2023-06-16: 5 mg via ORAL
  Filled 2023-06-16: qty 1

## 2023-06-16 MED ORDER — SODIUM CHLORIDE 0.9 % IV SOLN: INTRAVENOUS | Status: DC

## 2023-06-16 MED ORDER — CHLORHEXIDINE GLUCONATE CLOTH 2 % EX PADS
6.0000 | MEDICATED_PAD | Freq: Every day | CUTANEOUS | Status: DC
  Administered 2023-06-16 – 2023-06-17 (×2): 6 via TOPICAL

## 2023-06-16 MED ORDER — ACETAMINOPHEN 160 MG/5ML PO SOLN: 650.0000 mg | ORAL | Status: DC | PRN

## 2023-06-16 MED ORDER — LABETALOL HCL 5 MG/ML IV SOLN: 20.0000 mg | Freq: Once | INTRAVENOUS | Status: DC

## 2023-06-16 MED ORDER — ADULT MULTIVITAMIN W/MINERALS CH
1.0000 | ORAL_TABLET | Freq: Every day | ORAL | Status: DC
  Administered 2023-06-17: 1 via ORAL
  Filled 2023-06-16 (×2): qty 1

## 2023-06-16 MED ORDER — ACETAMINOPHEN 325 MG PO TABS: 650.0000 mg | ORAL_TABLET | ORAL | Status: DC | PRN

## 2023-06-16 MED ORDER — UMECLIDINIUM-VILANTEROL 62.5-25 MCG/ACT IN AEPB
1.0000 | INHALATION_SPRAY | Freq: Every day | RESPIRATORY_TRACT | Status: DC
  Filled 2023-06-16: qty 14

## 2023-06-16 NOTE — Progress Notes (Signed)
EEG complete - results pending 

## 2023-06-16 NOTE — H&P (Addendum)
Stroke Neurology Admission History & Physical   CC: Vertigo and generalized weakness  History is obtained from: Patient, family and chart  HPI: [name] is a 89 y.o. male with history of stroke, BPH, hypertension, hyperlipidemia, vertigo and mild cognitive impairment who presents with sudden onset generalized weakness and vertigo.  He presented to [facility] and was given tPA for presumed stroke at approximately 11 AM.  He was then transferred here for post thrombolytic monitoring and care.  Patient states that he had a previous stroke in which he developed left arm numbness, but that this resolved over about 10 to 14 days.   On for further discussion he has bouts of vertigo approximately 1 x/year where it is very severe requiring ER visits.  This vertigo episode started when he was leaning back in a recliner so his daughter could put eyedrops in his eyes.  He received 10 of Valium at outside hospital was reportedly made him unresponsive.  He does feel little nauseous but no vomiting.  LKW: 0730 tPA given? Yes 11:00 at [facility] Mechanical thrombectomy? No    ROS: Full ROS was performed and is negative except as noted in the HPI.   Past Medical History:  Diagnosis Date   Acid reflux    Arthritis    "right ankle" (05/11/2013)   Chronic lower back pain    Enlarged prostate    Hard of hearing    History of kidney stones    Hypercholesteremia    Hypertension    Sleep apnea    "had study; then OR; never wore mask" (05/11/2013)   Stroke [facility]) 1979   "fully recovered since then" (05/11/2013)   Vertigo    present intermittently for 40+ years     Family History  Problem Relation Age of Onset   Hypertension Mother    Heart attack Mother    Hypertension Father    COPD Father      Social History:   reports that he quit smoking about 44 years ago. His smoking use included cigarettes. He started smoking about 69 years ago. He has a 25 pack-year smoking history. He quit  smokeless tobacco use about 34 years ago.  His smokeless tobacco use included chew. He reports current alcohol use of about 5.0 standard drinks of alcohol per week. He reports that he does not use drugs.  Medications  Current Facility-Administered Medications:    [START ON 06/17/2023]  stroke: early stages of recovery book, , Does not apply, Once, de La Torre, Cortney E, NP   0.9 %  sodium chloride infusion, , Intravenous, Continuous, de Saintclair Halsted, Cortney E, NP   acetaminophen (TYLENOL) tablet 650 mg, 650 mg, Oral, Q4H PRN **OR** acetaminophen (TYLENOL) 160 MG/5ML solution 650 mg, 650 mg, Per Tube, Q4H PRN **OR** acetaminophen (TYLENOL) suppository 650 mg, 650 mg, Rectal, Q4H PRN, de Saintclair Halsted, Cortney E, NP   atorvastatin (LIPITOR) tablet 10 mg, 10 mg, Oral, QHS, de La Torre, Needmore E, NP   [START ON 06/17/2023] Chlorhexidine Gluconate Cloth 2 % PADS 6 each, 6 each, Topical, Q0600, Milon Dikes, MD   labetalol (NORMODYNE) injection 20 mg, 20 mg, Intravenous, Once **AND** clevidipine (CLEVIPREX) infusion 0.5 mg/mL, 0-21 mg/hr, Intravenous, Continuous, de La Torre, Cortney E, NP   melatonin tablet 5 mg, 5 mg, Oral, QHS, de La Torre, Cortney E, NP   multivitamin with minerals tablet 1 tablet, 1 tablet, Oral, Daily, de La Torre, Cortney E, NP   pantoprazole (PROTONIX) injection 40 mg,  40 mg, Intravenous, QHS, de La Torre, Cortney E, NP   polyethylene glycol (MIRALAX / GLYCOLAX) packet 17 g, 1 packet, Oral, BID, de La Torre, Cortney E, NP   senna-docusate (Senokot-S) tablet 1 tablet, 1 tablet, Oral, QHS PRN, de Saintclair Halsted, Cortney E, NP   tamsulosin (FLOMAX) capsule 0.4 mg, 0.4 mg, Oral, QPC supper, de [employer], Cortney E, NP   umeclidinium-vilanterol (ANORO ELLIPTA) 62.5-25 MCG/ACT 1 puff, 1 puff, Inhalation, Daily, de Saintclair Halsted, Cortney E, NP   Exam: Current vital signs: BP 125/78   Pulse 86   Resp (!) 21   SpO2 95%  Vital signs in last 24 hours: Pulse Rate:  [70-86] 86 (07/25 1445) Resp:   [14-21] 21 (07/25 1445) BP: (112-125)/(77-98) 125/78 (07/25 1445) SpO2:  [90 %-97 %] 95 % (07/25 1445)  GENERAL: Awake, alert in NAD HEENT: - Normocephalic and atraumatic, moist mm LUNGS -regular, unlabored respirations on room air CV - S1S2 RRR, regular rate and rhythm on the monitor ABDOMEN - Soft, nontender, nondistended  Ext: warm, well perfused, intact peripheral pulses, no edema  Head impact testing was attempted however he has a stiff neck likely from arthritis.  He did have some nystagmus more prominent on left lateral gaze and right.  No diplopia.  NEURO:  Mental Status: AA&Ox3  Language: speech is clear and fluent.  Naming, repetition, fluency, and comprehension intact. Cranial Nerves: PERRL EOMI, visual fields full, no facial asymmetry, facial sensation intact, hearing intact, phonation normal, normal sternocleidomastoid and trapezius muscle strength. No evidence of tongue atrophy or fibrillations Motor: 5/5 strength to bilateral upper and lower extremities Tone: is normal and bulk is normal Sensation- Intact to light touch bilaterally Coordination: FTN intact bilaterally Gait- deferred  NIHSS 1a Level of Conscious.: 0 1b LOC Questions: 0 1c LOC Commands: 0 2 Best Gaze: 0 3 Visual: 0 4 Facial Palsy: 0 5a Motor Arm - left: 0 5b Motor Arm - Right: 0 6a Motor Leg - Left: 0 6b Motor Leg - Right: 0 7 Limb Ataxia: 0 8 Sensory: 0 9 Best Language: 0 10 Dysarthria: 0 11 Extinct. and Inatten.: 0 TOTAL: 0   Labs I have reviewed labs in epic and the results pertinent to this consultation are:   Lipid Panel  Pending   Imaging I have reviewed the images obtained:  [facility] performed at [facility], no hemorrhage noted  MRI examination of the brain: pending, scheduled for 11:00 tomorrow  Assessment/Plan: 89 year old patient with history of stroke, BPH, hypertension, hyperlipidemia, vertigo and mild cognitive impairment presented with sudden onset generalized weakness  and vertigo.  Patient presented to [facility] emergency department at [facility], where he was given tPA to treat a presumptive stroke.  He was then transferred here for postthrombolytic monitoring and care.  Will obtain MRI brain tomorrow 24 hours post tPA and will also obtain resting EEG.  Strokelike symptoms status post tPA  Acuity: Acute Current Suspected Etiology: Unknown at this time Continue Evaluation:  -Admit to: ICU -Continue Statin -Hold Aspirin until 24 hour post IV thrombolysis (tPA) neuroimaging is stable and without evidence of bleeding - Blood Pressure Goal: BP less than 180/105 -MRI/ECHO/A1C/Lipid panel. -Hyperglycemia management per SSI to maintain glucose 140-180mg /dL. -PT/OT/ST therapies and recommendations when able   CV  History of hypertension, will hold home BP meds and maintain blood pressure less than 180/105, will use IV labetalol and Cleviprex if needed.  Hyperlipidemia, unspecified  -Continue home atorvastatin, will increase to high intensity statin if LDL is greater  than 70  -Vertigo: Will give Phenergan 12.5 mg IV.  Will consider giving 5 IV of Valium if his vertiginous symptoms persist.  Can consider steroid taper tomorrow if MRI is negative and vertigo still present.   Prophylaxis DVT: SCDs GI: Protonix IV Bowel: Senokot as needed  Diet: NPO until cleared by speech  Code Status: Full code, discussed with patient and family at bedside, he wishes to remain full code at this time.  Patient was seen by NP and then by MD, MD to edit note as needed   Cortney E Ernestina Columbia , MSN, AGACNP-BC [facility] See Amion for schedule and pager information 06/16/2023 3:27 PM   ATTENDING ATTESTATION:  89 year old with history of vertigo, tobacco abuse, history of stroke with acute onset of vertiginous symptoms concerning for posterior circulation CVA.  tPA was administered at [facility].  Transferred here for post tPA care.  He does have  history of vertigo which leads to ER visits/urgent care.  It is possible that he may have BPPV.  He does mention that rolling over in bed will trigger vertigo episodes.  He has tried meclizine in the past.  Will continue supportive care in the ICU.  MRI tomorrow.  Further recommendations to follow after studies are complete.  Patient and his daughter's questions were answered to their satisfaction.  Dr. Viviann Spare evaluated pt independently, reviewed imaging, chart, labs. Discussed and formulated plan with the Resident/APP. Changes were made to the note where appropriate. Please see APP/resident note above for details.   Total 36 minutes spent on counseling patient and coordinating care, writing notes and reviewing chart.  MDM: High. Pertinent labs, imaging results reviewed by me and considered in my decision making. Independently reviewed imaging. Medical records reviewed. Discussed the patient with another medical provider/personnel.  Dr. Jerrell Belfast and ICU nurse.  Obtained history from someone other than the patient-daughter.    Jacarri Gesner,MD

## 2023-06-16 NOTE — Plan of Care (Signed)
  Problem: Education: Goal: Knowledge of General Education information will improve Description: Including pain rating scale, medication(s)/side effects and non-pharmacologic comfort measures Outcome: Progressing   Problem: Clinical Measurements: Goal: Respiratory complications will improve Outcome: Progressing Goal: Cardiovascular complication will be avoided Outcome: Progressing   Problem: Activity: Goal: Risk for activity intolerance will decrease Outcome: Progressing   Problem: Nutrition: Goal: Adequate nutrition will be maintained 06/16/2023 1702 by Ramil Edgington, Pablo Ledger, RN Outcome: Progressing 06/16/2023 1456 by Shirl Harris, RN Outcome: Progressing   Problem: Coping: Goal: Level of anxiety will decrease 06/16/2023 1702 by Demarquez Ciolek, Pablo Ledger, RN Outcome: Progressing 06/16/2023 1456 by Shirl Harris, RN Outcome: Progressing   Problem: Elimination: Goal: Will not experience complications related to urinary retention Outcome: Progressing   Problem: Pain Managment: Goal: General experience of comfort will improve 06/16/2023 1702 by Barbee Mamula, Pablo Ledger, RN Outcome: Progressing 06/16/2023 1456 by Shirl Harris, RN Outcome: Progressing   Problem: Safety: Goal: Ability to remain free from injury will improve 06/16/2023 1702 by Bronnie Vasseur, Pablo Ledger, RN Outcome: Progressing 06/16/2023 1456 by Shirl Harris, RN Outcome: Progressing   Problem: Skin Integrity: Goal: Risk for impaired skin integrity will decrease 06/16/2023 1702 by Durga Saldarriaga, Pablo Ledger, RN Outcome: Progressing 06/16/2023 1456 by Shirl Harris, RN Outcome: Progressing

## 2023-06-17 ENCOUNTER — Inpatient Hospital Stay (HOSPITAL_COMMUNITY): Payer: Medicare Other

## 2023-06-17 DIAGNOSIS — R569 Unspecified convulsions: Secondary | ICD-10-CM

## 2023-06-17 DIAGNOSIS — I6389 Other cerebral infarction: Secondary | ICD-10-CM | POA: Diagnosis not present

## 2023-06-17 DIAGNOSIS — I639 Cerebral infarction, unspecified: Secondary | ICD-10-CM | POA: Diagnosis not present

## 2023-06-17 LAB — CBC: HCT: 40.3 % (ref 39.0–52.0)

## 2023-06-17 MED ORDER — ASPIRIN 81 MG PO TBEC
81.0000 mg | DELAYED_RELEASE_TABLET | Freq: Every day | ORAL | Status: DC
  Administered 2023-06-17: 81 mg via ORAL
  Filled 2023-06-17: qty 1

## 2023-06-17 MED ORDER — MECLIZINE HCL 12.5 MG PO TABS
12.5000 mg | ORAL_TABLET | Freq: Three times a day (TID) | ORAL | Status: DC | PRN
  Filled 2023-06-17: qty 1

## 2023-06-17 MED ORDER — PANTOPRAZOLE SODIUM 40 MG PO TBEC
40.0000 mg | DELAYED_RELEASE_TABLET | Freq: Every day | ORAL | Status: DC
  Administered 2023-06-17: 40 mg via ORAL
  Filled 2023-06-17: qty 1

## 2023-06-17 MED ORDER — PERFLUTREN LIPID MICROSPHERE
1.0000 mL | INTRAVENOUS | Status: AC | PRN
  Administered 2023-06-17: 4 mL via INTRAVENOUS

## 2023-06-17 MED ORDER — ASPIRIN 81 MG PO TBEC: 81.0000 mg | DELAYED_RELEASE_TABLET | Freq: Every day | ORAL | 12 refills | Status: AC

## 2023-06-17 MED ORDER — MECLIZINE HCL 12.5 MG PO TABS: 12.5000 mg | ORAL_TABLET | Freq: Three times a day (TID) | ORAL | 0 refills | Status: AC | PRN

## 2023-06-17 NOTE — Progress Notes (Signed)
OT Cancellation Note  Patient Details Name: [name] MRN: [id number] DOB: 1933/02/27   Cancelled Treatment:    Reason Eval/Treat Not Completed: Active bedrest order. Per orders pt is on strict bedrest until 1451 today.  Lindon Romp OT Acute Rehabilitation Services Office [phone]    Evette Georges 06/17/2023, 7:37 AM

## 2023-06-17 NOTE — Discharge Instructions (Addendum)
[name], you came to the hospital with vertigo, which was thought to be due to a posterior stroke.  You were treated with tPA at [facility] and transferred here for further monitoring.  Your MRI demonstrated that you did not have a stroke.  We have prescribed meclizine to treat your vertigo, and you will need to follow up with your primary care provider.  You will also need home health physical therapy to treat your vertigo.

## 2023-06-17 NOTE — Discharge Summary (Addendum)
Stroke Discharge Summary  Patient ID: [name]   MRN: [id number]      DOB: 01/21/1933  Date of Admission: 06/16/2023 Date of Discharge: 06/17/2023  Attending Physician:  Harolyn Rutherford, MD Consultant(s):    None  Patient's PCP:  Paulina Fusi, MD  DISCHARGE PRIMARY DIAGNOSIS: Strokelike symptoms status post tPA, vertigo  Secondary Diagnoses: Hypertension Hyperlipidemia  Allergies as of 06/17/2023   No Known Allergies      Medication List     STOP taking these medications    Moderna COVID-19 Bival Booster 50 MCG/0.5ML injection Generic drug: COVID-19 mRNA bivalent vaccine (Moderna)       TAKE these medications    amLODipine 5 MG tablet Commonly known as: NORVASC Take 5 mg by mouth daily.   Anoro Ellipta 62.5-25 MCG/INH Aepb Generic drug: umeclidinium-vilanterol Inhale 1 puff into the lungs daily.   aspirin EC 81 MG tablet Take 1 tablet (81 mg total) by mouth daily. Swallow whole. Start taking on: June 18, 2023 What changed:  when to take this additional instructions   atorvastatin 10 MG tablet Commonly known as: LIPITOR Take 10 mg by mouth at bedtime.   losartan 100 MG tablet Commonly known as: COZAAR Take 100 mg by mouth at bedtime.   meclizine 12.5 MG tablet Commonly known as: ANTIVERT Take 1 tablet (12.5 mg total) by mouth 3 (three) times daily as needed for dizziness.   melatonin 5 MG Tabs Take 5 mg by mouth at bedtime.   metoprolol tartrate 100 MG tablet Commonly known as: LOPRESSOR Take 100 mg by mouth 2 (two) times daily.   multivitamin with minerals Tabs tablet Take 1 tablet by mouth daily.   pantoprazole 40 MG tablet Commonly known as: PROTONIX Take 40 mg by mouth daily.   tamsulosin 0.4 MG Caps capsule Commonly known as: FLOMAX Take 0.4 mg by mouth every evening.        LABORATORY STUDIES CBC    Component Value Date/Time   WBC 7.2 06/17/2023 0440   RBC 4.30 06/17/2023 0440   HGB 12.8 (L) 06/17/2023 0440    HCT 40.3 06/17/2023 0440   PLT 201 06/17/2023 0440   MCV 93.7 06/17/2023 0440   MCH 29.8 06/17/2023 0440   MCHC 31.8 06/17/2023 0440   RDW 13.6 06/17/2023 0440   CMP    Component Value Date/Time   NA 140 06/17/2023 0440   K 3.8 06/17/2023 0440   CL 109 06/17/2023 0440   CO2 23 06/17/2023 0440   GLUCOSE 96 06/17/2023 0440   BUN 18 06/17/2023 0440   CREATININE 0.96 06/17/2023 0440   CALCIUM 8.3 (L) 06/17/2023 0440   PROT 6.0 (L) 06/16/2023 1546   ALBUMIN 3.7 06/16/2023 1546   AST 26 06/16/2023 1546   ALT 25 06/16/2023 1546   ALKPHOS 112 06/16/2023 1546   BILITOT 1.1 06/16/2023 1546   GFRNONAA >60 06/17/2023 0440   GFRAA >60 03/02/2018 1539   COAGSNo results found for: "INR", "PROTIME" Lipid Panel    Component Value Date/Time   CHOL 117 06/16/2023 1546   TRIG 41 06/16/2023 1546   HDL 42 06/16/2023 1546   CHOLHDL 2.8 06/16/2023 1546   VLDL 8 06/16/2023 1546   LDLCALC 67 06/16/2023 1546   HgbA1C  Lab Results  Component Value Date   HGBA1C 5.9 (H) 06/16/2023   No results found for: "ETH"   SIGNIFICANT DIAGNOSTIC STUDIES MR BRAIN WO CONTRAST  Result Date: 06/17/2023 CLINICAL DATA:  Stroke, follow up.  EXAM: MRI HEAD WITHOUT CONTRAST TECHNIQUE: Multiplanar, multiecho pulse sequences of the brain and surrounding structures were obtained without intravenous contrast. COMPARISON:  Brain MRI 08/26/2022. FINDINGS: Brain: No acute infarct or hemorrhage. Encephalomalacia from prior infarct along the lateral aspect of the right central sulcus. Background of moderate chronic small-vessel disease. No hydrocephalus or extra-axial collection. No mass or midline shift. No abnormal susceptibility. Vascular: Normal flow voids. Skull and upper cervical spine: Moderate degenerative changes of the upper cervical spine. Sinuses/Orbits: No acute findings. Other: None. IMPRESSION: 1. No acute intracranial process. 2. Moderate chronic small-vessel disease with old infarct along the lateral aspect  of the right central sulcus. Electronically Signed   By: Orvan Falconer M.D.   On: 06/17/2023 14:47   ECHOCARDIOGRAM COMPLETE  Result Date: 06/17/2023    ECHOCARDIOGRAM REPORT   Patient Name:   [name] Date of Exam: 06/17/2023 Medical Rec #:  [id number]     Height:       67.0 in Accession #:    [id number]    Weight:       185.5 lb Date of Birth:  31-Jan-1933      BSA:          1.959 m Patient Age:    89 years      BP:           128/67 mmHg Patient Gender: M             HR:           59 bpm. Exam Location:  Inpatient Procedure: 2D Echo, Cardiac Doppler, Color Doppler and Intracardiac            Opacification Agent Indications:    Stroke  History:        Patient has no prior history of Echocardiogram examinations.                 Risk Factors:Hypertension, Dyslipidemia, Sleep Apnea and Former                 Smoker.  Sonographer:    Raeford Razor Referring Phys: [id number] CORTNEY E DE LA TORRE  Sonographer Comments: Image acquisition challenging due to patient body habitus. IMPRESSIONS  1. Left ventricular ejection fraction, by estimation, is 60 to 65%. The left ventricle has normal function. The left ventricle has no regional wall motion abnormalities.  2. Right ventricular systolic function is normal. The right ventricular size is normal.  3. The mitral valve is normal in structure. Mild mitral valve regurgitation.  4. The aortic valve is tricuspid. Aortic valve regurgitation is mild. Aortic valve sclerosis is present, with no evidence of aortic valve stenosis.  5. The inferior vena cava is normal in size with greater than 50% respiratory variability, suggesting right atrial pressure of 3 mmHg. FINDINGS  Left Ventricle: Left ventricular ejection fraction, by estimation, is 60 to 65%. The left ventricle has normal function. The left ventricle has no regional wall motion abnormalities. Definity contrast agent was given IV to delineate the left ventricular  endocardial borders. The left ventricular internal cavity  size was normal in size. There is no left ventricular hypertrophy. Right Ventricle: The right ventricular size is normal. Right vetricular wall thickness was not assessed. Right ventricular systolic function is normal. Left Atrium: Left atrial size was normal in size. Right Atrium: Right atrial size was normal in size. Pericardium: Trivial pericardial effusion is present. Mitral Valve: The mitral valve is normal in structure. Mild mitral valve regurgitation. Tricuspid Valve: The  tricuspid valve is normal in structure. Tricuspid valve regurgitation is not demonstrated. Aortic Valve: The aortic valve is tricuspid. Aortic valve regurgitation is mild. Aortic regurgitation PHT measures 514 msec. Aortic valve sclerosis is present, with no evidence of aortic valve stenosis. Aortic valve peak gradient measures 6.2 mmHg. Pulmonic Valve: The pulmonic valve was normal in structure. Pulmonic valve regurgitation is not visualized. Aorta: The aortic root is normal in size and structure. Venous: The inferior vena cava is normal in size with greater than 50% respiratory variability, suggesting right atrial pressure of 3 mmHg. IAS/Shunts: No atrial level shunt detected by color flow Doppler.  LEFT VENTRICLE PLAX 2D LVIDd:         5.00 cm      Diastology LVIDs:         3.20 cm      LV e' medial:    3.92 cm/s LV PW:         1.00 cm      LV E/e' medial:  26.3 LV IVS:        1.00 cm      LV e' lateral:   6.20 cm/s LVOT diam:     2.00 cm      LV E/e' lateral: 16.6 LV SV:         72 LV SV Index:   37 LVOT Area:     3.14 cm  LV Volumes (MOD) LV vol d, MOD A2C: 127.0 ml LV vol d, MOD A4C: 103.0 ml LV vol s, MOD A2C: 43.1 ml LV vol s, MOD A4C: 37.1 ml LV SV MOD A2C:     83.9 ml LV SV MOD A4C:     103.0 ml LV SV MOD BP:      77.4 ml RIGHT VENTRICLE             IVC RV Basal diam:  3.10 cm     IVC diam: 1.20 cm RV S prime:     16.50 cm/s TAPSE (M-mode): 3.9 cm LEFT ATRIUM             Index        RIGHT ATRIUM           Index LA diam:         3.90 cm 1.99 cm/m   RA Area:     13.10 cm LA Vol (A2C):   67.0 ml 34.21 ml/m  RA Volume:   28.30 ml  14.45 ml/m LA Vol (A4C):   47.3 ml 24.15 ml/m LA Biplane Vol: 57.0 ml 29.10 ml/m  AORTIC VALVE AV Area (Vmax): 2.29 cm AV Vmax:        124.00 cm/s AV Peak Grad:   6.2 mmHg LVOT Vmax:      90.50 cm/s LVOT Vmean:     58.600 cm/s LVOT VTI:       0.230 m AI PHT:         514 msec  AORTA Ao Root diam: 3.20 cm Ao Asc diam:  3.80 cm MITRAL VALVE MV Area (PHT): 3.77 cm     SHUNTS MV Decel Time: 201 msec     Systemic VTI:  0.23 m MV E velocity: 103.00 cm/s  Systemic Diam: 2.00 cm MV A velocity: 86.90 cm/s MV E/A ratio:  1.19 Dietrich Pates MD Electronically signed by Dietrich Pates MD Signature Date/Time: 06/17/2023/12:17:25 PM    Final    EEG adult  Result Date: 06/17/2023 Charlsie Quest, MD     06/17/2023  8:37 AM  Patient Name: EZREAL MARSHBURN MRN: [id number] Epilepsy Attending: Charlsie Quest Referring Physician/Provider: Marjorie Smolder, NP Date: 06/16/2023 Duration: 23.04 mins Patient history:  89 y.o. male with history of stroke, BPH, hypertension, hyperlipidemia, vertigo and mild cognitive impairment who presents with sudden onset generalized weakness and vertigo. EEG to evaluate for seizure Level of alertness: Awake, asleep AEDs during EEG study: None Technical aspects: This EEG study was done with scalp electrodes positioned according to the 10-20 International system of electrode placement. Electrical activity was reviewed with band pass filter of 1-70Hz , sensitivity of 7 uV/mm, display speed of 42mm/sec with a 60Hz  notched filter applied as appropriate. EEG data were recorded continuously and digitally stored.  Video monitoring was available and reviewed as appropriate. Description: The posterior dominant rhythm consists of 9-10 Hz activity of moderate voltage (25-35 uV) seen predominantly in posterior head regions, symmetric and reactive to eye opening and eye closing. Sleep was characterized by vertex  waves, sleep spindles (12 to 14 Hz), maximal frontocentral region. Hyperventilation and photic stimulation were not performed.   IMPRESSION: This study is within normal limits. No seizures or epileptiform discharges were seen throughout the recording. A normal interictal EEG does not exclude the diagnosis of epilepsy. Priyanka Annabelle Harman       HISTORY OF PRESENT ILLNESS 89 y.o. patient with history of stroke, BPH, hypertension, hyperlipidemia, vertigo and mild cognitive impairment was admitted with sudden onset generalized weakness and vertigo.  HOSPITAL COURSE Patient initially presented to [facility], where he was treated for presumed posterior stroke with tPA.  He was then transferred here for further care and monitoring.  MRI was negative for acute infarct, and patient was prescribed meclizine and given referral for home health physical therapy to treat vertigo.  Strokelike symptoms including vertigo status post tPA MRI no acute infarct, moderate chronic small vessel disease 2D Echo EF 60 to 65%, normal left atrial size, no atrial level shunt LDL 67 HgbA1c 5.9 VTE prophylaxis -SCDs aspirin 81 mg daily prior to admission, now on aspirin 81 mg daily f Therapy recommendations:  Home Health PT Disposition: Home  Hx of Stroke/TIA Patient has history of a stroke 7 years ago with left-sided numbness which has completely resolved  Hypertension Home meds: Amlodipine 5 mg daily, losartan 100 mg daily Stable Maintain normotension  Hyperlipidemia Home meds: Atorvastatin 10 mg daily, resumed in hospital LDL 67, goal < 70 High intensity statin not indicated as LDL below goal Continue statin at discharge   RN Pressure Injury Documentation:     DISCHARGE EXAM  PHYSICAL EXAM General:  Alert, well-nourished, well-developed patient in no acute distress Psych:  Mood and affect appropriate for situation CV: Regular rate and rhythm on monitor Respiratory:  Regular, unlabored respirations  on room air GI: Abdomen soft and nontender  NEURO:  Mental Status: AA&Ox3  Speech/Language: speech is without dysarthria or aphasia.  Naming, fluency, and comprehension intact.  Cranial Nerves:  II: PERRL. Visual fields full.  III, IV, VI: EOMI. Eyelids elevate symmetrically.  V: Sensation is intact to light touch and symmetrical to face.  VII: Smile is symmetrical.  VIII: hearing intact to voice. IX, X: Phonation is normal.  XII: tongue is midline without fasciculations. Motor: 5/5 strength to all muscle groups tested.  Tone: is normal and bulk is normal Sensation- Intact to light touch bilaterally.  Coordination: FTN intact bilaterally.No drift.  Gait- deferred   Discharge Diet       Diet   Diet regular Room  service appropriate? Yes with Assist; Fluid consistency: Thin   liquids  DISCHARGE PLAN Disposition: Home aspirin 81 mg daily for secondary stroke prevention. Ongoing stroke risk factor control by Primary Care Physician at time of discharge Follow-up PCP Paulina Fusi, MD in 2 weeks.  35 minutes were spent preparing discharge.  Cortney E Ernestina Columbia , MSN, AGACNP-BC [facility] See Amion for schedule and pager information 06/17/2023 5:01 PM   ATTENDING ATTESTATION:  Dr. Viviann Spare evaluated pt independently, reviewed imaging, chart, labs. Discussed and formulated plan with the Resident/APP. Changes were made to the note where appropriate. Please see APP/resident note above for details.   Total 36 minutes spent on counseling patient and coordinating care, writing notes and reviewing chart.    Milessa Hogan,MD

## 2023-06-17 NOTE — Procedures (Signed)
Patient Name: [name]  MRN: [id number]  Epilepsy Attending: Charlsie Quest  Referring Physician/Provider: Marjorie Smolder, NP  Date: 06/16/2023 Duration: 23.04 mins  Patient history:  89 y.o. male with history of stroke, BPH, hypertension, hyperlipidemia, vertigo and mild cognitive impairment who presents with sudden onset generalized weakness and vertigo. EEG to evaluate for seizure  Level of alertness: Awake, asleep  AEDs during EEG study: None  Technical aspects: This EEG study was done with scalp electrodes positioned according to the 10-20 International system of electrode placement. Electrical activity was reviewed with band pass filter of 1-70Hz , sensitivity of 7 uV/mm, display speed of 80mm/sec with a 60Hz  notched filter applied as appropriate. EEG data were recorded continuously and digitally stored.  Video monitoring was available and reviewed as appropriate.  Description: The posterior dominant rhythm consists of 9-10 Hz activity of moderate voltage (25-35 uV) seen predominantly in posterior head regions, symmetric and reactive to eye opening and eye closing. Sleep was characterized by vertex waves, sleep spindles (12 to 14 Hz), maximal frontocentral region. Hyperventilation and photic stimulation were not performed.     IMPRESSION: This study is within normal limits. No seizures or epileptiform discharges were seen throughout the recording.  A normal interictal EEG does not exclude the diagnosis of epilepsy.  Gladie Gravette Annabelle Harman

## 2023-06-17 NOTE — Plan of Care (Signed)
°  Problem: Education: °Goal: Knowledge of General Education information will improve °Description: Including pain rating scale, medication(s)/side effects and non-pharmacologic comfort measures °Outcome: Progressing °  °Problem: Clinical Measurements: °Goal: Ability to maintain clinical measurements within normal limits will improve °Outcome: Progressing °  °Problem: Nutrition: °Goal: Adequate nutrition will be maintained °Outcome: Progressing °  °

## 2023-06-17 NOTE — Evaluation (Signed)
Physical Therapy Evaluation Patient Details Name: [name] MRN: [id number] DOB: Aug 30, 1933 Today's Date: 06/17/2023  History of Present Illness  [name] is a 89 y.o. male admitted 06/16/23 with sudden onset generalized weakness and vertigo.  He presented to [facility] and was given tPA for presumed stroke at approximately 11 AM.  History of stroke, BPH, hypertension, hyperlipidemia, L total ankle arthroplasty 2019, R ankle fusion 2014, lumbar surgery 2011, vertigo and mild cognitive impairment.  Clinical Impression  Patient presents with symptoms consistent with L unilateral vestibular hypofunction though has 40 year history of vertigo so this is likely exacerbation of previous issue possibly due to auricular congestion per pt report.  Noted no symptoms of BPPV though he could have had since issue initiated with head tilted back.  He reported symptoms more of light headedness though than true spinning.  Encouraged to continue to seek medical attention if experiencing different dizziness that does not resolve as is also a symptoms of stroke.  Patient able to ambulate in hallway with RW and reports mobilizing about as usual with his peripheral neuropathy and prior ankle and hip pain issues.  Educated on issues with fall risk due to impaired vestibular system, peripheral neuropathy with limited ankle AROM and that he is likely much more visually reliant so need for stable supportive footwear and good lighting as well as using his walker in the home.  Issued initiate seated vestibular adaptation HEP though will benefit from continued HHPT at d/c.    Vestibular Assessment - 06/17/23 1534       Symptom Behavior   Subjective history of current problem Reports symptoms of "dizziness" started when tilting head back for eye drops and did not go away so sought medical attention.    Type of Dizziness  Lightheadedness;"Funny feeling in head"    Frequency of Dizziness intermittent    Duration of  Dizziness minutes    Symptom Nature Motion provoked;Intermittent;Variable    Aggravating Factors Activity in general;Sitting with head tilted back   sit to supine   Relieving Factors --   rolling on side, looking down   Progression of Symptoms Better    History of similar episodes yes, had episode last October and roughly yearly for about 40 years, reports wife would take him to get a "sedative" when had symptoms usually at night.  States takes Meclizine when has symptoms.      Oculomotor Exam   Oculomotor Alignment Abnormal   L eye more lateral, reports occasional double vision   Ocular ROM WNL    Spontaneous Absent    Gaze-induced  Absent    Smooth Pursuits Intact   increased symptoms   Saccades Intact   increased symptoms     Oculomotor Exam-Fixation Suppressed    Left Head Impulse positive for very quick refixation    Right Head Impulse positive for very delayed refixation      Vestibulo-Ocular Reflex   VOR 1 Head Only (x 1 viewing) horizontal head movements intact though increased symptoms, also reported mild nausea      Auditory   Comments intact to scratch test bilateral with decreased hearing (Chronic) on R as compared to L      Positional Testing   Dix-Hallpike Dix-Hallpike Right;Dix-Hallpike Left    Horizontal Canal Testing Horizontal Canal Left;Horizontal Canal Right      Dix-Hallpike Right   Dix-Hallpike Right Duration in positition about a minute   bed in trendelenberg   Dix-Hallpike Right Symptoms No nystagmus  Dix-Hallpike Left   Dix-Hallpike Left Duration in position about a minute   bed in trendelenberg, positive light headedness on R more than L   Dix-Hallpike Left Symptoms No nystagmus      Horizontal Canal Right   Horizontal Canal Right Duration in position one minute    Horizontal Canal Right Symptoms Normal      Horizontal Canal Left   Horizontal Canal Left Duration in position one minute    Horizontal Canal Left Symptoms Normal                 Assistance Recommended at Discharge Intermittent Supervision/Assistance  If plan is discharge home, recommend the following:  Can travel by private vehicle  Help with stairs or ramp for entrance;Assist for transportation;Assistance with cooking/housework;A little help with bathing/dressing/bathroom;A little help with walking and/or transfers        Equipment Recommendations None recommended by PT  Recommendations for Other Services       Functional Status Assessment Patient has had a recent decline in their functional status and demonstrates the ability to make significant improvements in function in a reasonable and predictable amount of time.     Precautions / Restrictions Precautions Precautions: Fall      Mobility  Bed Mobility Overal bed mobility: Needs Assistance Bed Mobility: Sidelying to Sit   Sidelying to sit: Min guard       General bed mobility comments: already rolled to R for BPPV testing, assist for balance    Transfers Overall transfer level: Needs assistance Equipment used: Rolling walker (2 wheels) Transfers: Sit to/from Stand Sit to Stand: Min guard           General transfer comment: assist for balance as pt still reporting some dizziness, tested BP in standing WNL    Ambulation/Gait Ambulation/Gait assistance: Supervision, Min guard Gait Distance (Feet): 150 Feet Assistive device: Rolling walker (2 wheels) Gait Pattern/deviations: Step-through pattern, Decreased stride length, Shuffle, Wide base of support, Trunk flexed       General Gait Details: reports has to shuffle his feet due to ankle limitations; flexed posture reports h/o back surgery and hip pain when he walks  Stairs            Wheelchair Mobility     Tilt Bed    Modified Rankin (Stroke Patients Only)       Balance Overall balance assessment: Needs assistance   Sitting balance-Leahy Scale: Good     Standing balance support: Reliant on assistive device  for balance Standing balance-Leahy Scale: Poor Standing balance comment: uses RW at baseline                             Pertinent Vitals/Pain Pain Assessment Pain Assessment: No/denies pain    Home Living Family/patient expects to be discharged to:: Private residence Living Arrangements: Alone Available Help at Discharge: Family;Available PRN/intermittently Type of Home: House Home Access: Stairs to enter Entrance Stairs-Rails: Right Entrance Stairs-Number of Steps: 2   Home Layout: One level Home Equipment: Shower seat;Grab bars - tub/shower;Rollator (4 wheels);Rolling Walker (2 wheels);Cane - single point      Prior Function Prior Level of Function : Independent/Modified Independent             Mobility Comments: uses cane or walker depending on how he feels; fell x 1 in past 6 months sat hard in the shower ADLs Comments: has meals on wheels 5x/week, still drives, son helps with groceries  or whatever he needs help with.     Hand Dominance   Dominant Hand: Right    Extremity/Trunk Assessment   Upper Extremity Assessment Upper Extremity Assessment: Overall WFL for tasks assessed    Lower Extremity Assessment Lower Extremity Assessment: LLE deficits/detail;RLE deficits/detail RLE Deficits / Details: AROM ankle limited due to history of fracture/fusion; otherwise WFL, strength grossly 4+/5 peripheral neuropathy present. RLE Sensation: history of peripheral neuropathy LLE Deficits / Details: AROM ankle limited due to history of ankle arthroplasty; otherwise WFL, strength grossly 4+/5 peripheral neuropathy present. LLE Sensation: history of peripheral neuropathy    Cervical / Trunk Assessment Cervical / Trunk Assessment: Kyphotic;Other exceptions Cervical / Trunk Exceptions: cervical ROM limitations worse with R rotation than L and states pain into R shoulder  Communication   Communication: HOH  Cognition Arousal/Alertness: Awake/alert Behavior During  Therapy: WFL for tasks assessed/performed Overall Cognitive Status: History of cognitive impairments - at baseline                                          General Comments General comments (skin integrity, edema, etc.): Educated in peripheral vestibular hypofunction and how it improves and can get exacerbated with viral infection, allergies, limited mobility, etc.    Exercises Other Exercises Other Exercises: Issued HEP for seated vestibular adaptation and demonstrated, pt relates has done in the past   Assessment/Plan    PT Assessment Patient needs continued PT services  PT Problem List Decreased balance;Impaired sensation;Other (comment);Decreased activity tolerance;Decreased mobility (dizziness)       PT Treatment Interventions DME instruction;Functional mobility training;Balance training;Patient/family education;Therapeutic activities;Gait training;Therapeutic exercise;Other (comment) (vestibular adaptation)    PT Goals (Current goals can be found in the Care Plan section)  Acute Rehab PT Goals Patient Stated Goal: return home PT Goal Formulation: With patient/family Time For Goal Achievement: 07/01/23 Potential to Achieve Goals: Good    Frequency Min 1X/week     Co-evaluation               AM-PAC PT "6 Clicks" Mobility  Outcome Measure Help needed turning from your back to your side while in a flat bed without using bedrails?: A Little Help needed moving from lying on your back to sitting on the side of a flat bed without using bedrails?: A Little Help needed moving to and from a bed to a chair (including a wheelchair)?: A Little Help needed standing up from a chair using your arms (e.g., wheelchair or bedside chair)?: A Little Help needed to walk in hospital room?: A Little Help needed climbing 3-5 steps with a railing? : Total 6 Click Score: 16    End of Session Equipment Utilized During Treatment: Gait belt Activity Tolerance: Patient  tolerated treatment well Patient left: in bed;with call bell/phone within reach;with family/visitor present   PT Visit Diagnosis: Other abnormalities of gait and mobility (R26.89);Dizziness and giddiness (R42);Muscle weakness (generalized) (M62.81)    Time: 2694-8546 PT Time Calculation (min) (ACUTE ONLY): 40 min   Charges:   PT Evaluation $PT Eval Moderate Complexity: 1 Mod PT Treatments $Gait Training: 8-22 mins $Neuromuscular Re-education: 8-22 mins PT General Charges $$ ACUTE PT VISIT: 1 Visit         Sheran Lawless, PT Acute Rehabilitation Services [phone] 06/17/2023   Elray Mcgregor 06/17/2023, 3:34 PM

## 2023-06-17 NOTE — Progress Notes (Signed)
Echocardiogram 2D Echocardiogram has been performed.  [name] 06/17/2023, 10:01 AM

## 2023-06-17 NOTE — TOC Transition Note (Signed)
Transition of Care [facility]) - CM/SW Discharge Note   Patient Details  Name: [name] MRN: [id number] Date of Birth: 1933/09/28  [facility] CM/SW Contact:  Epifanio Lesches, RN Phone Number: 06/17/2023, 6:15 PM   Clinical Narrative:    Patient will DC to: Home  Anticipated DC date: 06/17/2023 Family notified: yes Transport by: car  Admitted  with sudden onset generalized weakness and vertigo. Per MD patient ready for DC today . RN, patient, and patient's family notified of DC. Pt is from home alone. Per son he and sister will assist with needed care @ d/c. Pt already with RW @ home. Order noted for home health PT. Pt/son agreeable to home health services. Pt without provider preference. Referral made with [facility] and accepted .  Pt without RX med concerns . Post hospital f/u noted on AVS.  Son to provide transportation to home.  RNCM will sign off for now as intervention is no longer needed. Please consult [place] again if new needs arise.   Final next level of care: Home w Home Health Services Barriers to Discharge: No Barriers Identified   Patient Goals and CMS Choice   Choice offered to / list presented to : Adult Children  Discharge Placement                         Discharge Plan and Services Additional resources added to the After Visit Summary for                            [facility] Arranged: PT [facility] Agency: [facility] Date [facility] Agency Contacted: 06/17/23 Time HH Agency Contacted: [phone]    Social Determinants of Health (SDOH) Interventions SDOH Screenings   Food Insecurity: No Food Insecurity (09/24/2022)  Housing: Low Risk  (09/24/2022)  Transportation Needs: No Transportation Needs (09/24/2022)  Tobacco Use: Medium Risk (02/24/2023)     Readmission Risk Interventions     No data to display

## 2023-06-17 NOTE — Progress Notes (Signed)
Patient with DC orders to home. All discharge instructions reviewed with patient, daughter, and son with good understanding verbalized. PIV's removed. Patient taken off unit via wc. All belongings gathered.

## 2023-06-22 DIAGNOSIS — R2990 Unspecified symptoms and signs involving the nervous system: Secondary | ICD-10-CM | POA: Diagnosis not present

## 2023-06-22 DIAGNOSIS — I10 Essential (primary) hypertension: Secondary | ICD-10-CM | POA: Diagnosis not present

## 2023-06-22 DIAGNOSIS — E785 Hyperlipidemia, unspecified: Secondary | ICD-10-CM | POA: Diagnosis not present

## 2023-06-22 DIAGNOSIS — R42 Dizziness and giddiness: Secondary | ICD-10-CM | POA: Diagnosis not present

## 2023-06-22 DIAGNOSIS — H8113 Benign paroxysmal vertigo, bilateral: Secondary | ICD-10-CM | POA: Diagnosis not present

## 2023-06-22 DIAGNOSIS — H6122 Impacted cerumen, left ear: Secondary | ICD-10-CM | POA: Diagnosis not present

## 2023-06-22 DIAGNOSIS — G3184 Mild cognitive impairment, so stated: Secondary | ICD-10-CM | POA: Diagnosis not present

## 2023-06-24 ENCOUNTER — Telehealth: Payer: Self-pay | Admitting: *Deleted

## 2023-06-24 NOTE — Progress Notes (Signed)
  Care Coordination   Note   06/24/2023 Name: [name] MRN: [id number] DOB: 07/12/1933  [name] is a 89 y.o. year old male who sees Paulina Fusi, MD for primary care. I reached out to [name] by phone today to offer care coordination services.  Mr. Aldape was given information about Care Coordination services today including:   The Care Coordination services include support from the care team which includes your Nurse Coordinator, Clinical Social Worker, or Pharmacist.  The Care Coordination team is here to help remove barriers to the health concerns and goals most important to you. Care Coordination services are voluntary, and the patient may decline or stop services at any time by request to their care team member.   Care Coordination Consent Status: Patient agreed to services and verbal consent obtained.   Follow up plan:  Telephone appointment with care coordination team member scheduled for:  07/01/2023  Encounter Outcome:  Pt. Scheduled   Burman Nieves, CCMA Care Coordination Care Guide Direct Dial: [phone]

## 2023-06-27 DIAGNOSIS — H8113 Benign paroxysmal vertigo, bilateral: Secondary | ICD-10-CM | POA: Diagnosis not present

## 2023-06-27 DIAGNOSIS — E785 Hyperlipidemia, unspecified: Secondary | ICD-10-CM | POA: Diagnosis not present

## 2023-06-27 DIAGNOSIS — I10 Essential (primary) hypertension: Secondary | ICD-10-CM | POA: Diagnosis not present

## 2023-06-27 DIAGNOSIS — G3184 Mild cognitive impairment, so stated: Secondary | ICD-10-CM | POA: Diagnosis not present

## 2023-07-01 ENCOUNTER — Ambulatory Visit: Payer: Self-pay | Admitting: *Deleted

## 2023-07-04 NOTE — Patient Instructions (Signed)
Visit Information  Thank you for taking time to visit with me today. Please don't hesitate to contact me if I can be of assistance to you.   Following are the goals we discussed today:   Goals Addressed             This Visit's Progress    Improve mental health wellness       Activities and task to complete in order to accomplish goals.   You have decided not to move forward with counseling and would like to work with me on brief coping skills to assist you with managing   Self Support options  (Consider activities at the [facility] and elsewhere to decrease loneliness) Find out more about the Aid and Attendance program to see if you qualify [phone]         Our next appointment is by telephone on 08/02/23    Please call the care guide team at [phone] if you need to cancel or reschedule your appointment.   If you are experiencing a Mental Health or Behavioral Health Crisis or need someone to talk to, please call the Suicide and Crisis Lifeline: 988 call 911   The patient verbalized understanding of instructions, educational materials, and care plan provided today and DECLINED offer to receive copy of patient instructions, educational materials, and care plan.   Telephone follow up appointment with care management team member scheduled for: 08/02/23  Reece Levy, MSW, LCSW Clinical Social Worker [facility] [phone]

## 2023-07-04 NOTE — Patient Outreach (Signed)
  Care Coordination   Initial Visit Note   07/04/2023 Name: [name] MRN: [id number] DOB: 05/15/1933  ROCHELL Raymond is a 89 y.o. year old male who sees Paulina Fusi, MD for primary care. I spoke with  [name] by phone today.  What matters to the patients health and wellness today?  Pt reports answering the post hospital call question in regards to feeling "lonesome"; not depressed    Goals Addressed             This Visit's Progress    Improve mental health wellness       Activities and task to complete in order to accomplish goals.   You have decided not to move forward with counseling and would like to work with me on brief coping skills to assist you with managing   Self Support options  (Consider activities at the [facility] and elsewhere to decrease loneliness) Find out more about the Aid and Attendance program to see if you qualify [phone]         SDOH assessments and interventions completed:  Yes  SDOH Interventions Today    Flowsheet Row Most Recent Value  SDOH Interventions   Food Insecurity Interventions Intervention Not Indicated  Housing Interventions Intervention Not Indicated  Transportation Interventions Intervention Not Indicated  Utilities Interventions Intervention Not Indicated  Social Connections Interventions Patient Declined, Other (Comment)  [suggested connecting with local [facility] activities]        Care Coordination Interventions:  Yes, provided  Interventions Today    Flowsheet Row Most Recent Value  Chronic Disease   Chronic disease during today's visit Other  [Pt reports sadness and loneliness]  General Interventions   General Interventions Discussed/Reviewed General Interventions Discussed, Community Resources  Exercise Interventions   Exercise Discussed/Reviewed Exercise Discussed  [Pt reports [facility] is working with him 1 time weekly]  Mental Health Interventions   Mental Health Discussed/Reviewed Mental  Health Discussed, Mental Health Reviewed, Coping Strategies, Depression, Other  [Pt reports he is more "lonely" than depressed- he drives and likes to go for rides and not interested in large groups- pt enjoys his weekly phone call from [place] as well as talking with friends,e tc]  Nutrition Interventions   Nutrition Discussed/Reviewed Nutrition Discussed  [Pt gets Meals on Wheels delivery]  Safety Interventions   Safety Discussed/Reviewed Safety Discussed  [CSW provided pt with 988 phone # to call for mental health support- 24/7]       Follow up plan: Follow up call scheduled for 08/02/23    Encounter Outcome:  Pt. Visit Completed

## 2023-07-05 DIAGNOSIS — E785 Hyperlipidemia, unspecified: Secondary | ICD-10-CM | POA: Diagnosis not present

## 2023-07-05 DIAGNOSIS — I10 Essential (primary) hypertension: Secondary | ICD-10-CM | POA: Diagnosis not present

## 2023-07-05 DIAGNOSIS — G3184 Mild cognitive impairment, so stated: Secondary | ICD-10-CM | POA: Diagnosis not present

## 2023-07-05 DIAGNOSIS — H8113 Benign paroxysmal vertigo, bilateral: Secondary | ICD-10-CM | POA: Diagnosis not present

## 2023-07-12 DIAGNOSIS — H8113 Benign paroxysmal vertigo, bilateral: Secondary | ICD-10-CM | POA: Diagnosis not present

## 2023-07-12 DIAGNOSIS — G3184 Mild cognitive impairment, so stated: Secondary | ICD-10-CM | POA: Diagnosis not present

## 2023-07-12 DIAGNOSIS — E785 Hyperlipidemia, unspecified: Secondary | ICD-10-CM | POA: Diagnosis not present

## 2023-07-12 DIAGNOSIS — I10 Essential (primary) hypertension: Secondary | ICD-10-CM | POA: Diagnosis not present

## 2023-08-02 ENCOUNTER — Ambulatory Visit: Payer: Self-pay | Admitting: *Deleted

## 2023-08-02 NOTE — Patient Outreach (Addendum)
  Care Coordination   Follow Up Visit Note   08/02/2023 Name: [name] MRN: [id number] DOB: 1933/06/11  [name] is a 89 y.o. year old male who sees Paulina Fusi, MD for primary care. I spoke with  [name] by phone today.  What matters to the patients health and wellness today?  Might want to look into some sitter/companionship through the [facility].    Goals Addressed             This Visit's Progress    Improve mental health wellness       Activities and task to complete in order to accomplish goals.   You have decided not to move forward with counseling and would like to work with me on brief coping skills to assist you with managing   Self Support options  (Call friends/family, consider activities at the [facility] and elsewhere to decrease loneliness) Follow up on the caregiver/companionship program offered to you by meals on wheels delivery Find out more about the Aid and Attendance program to see if you qualify [phone]         SDOH assessments and interventions completed:  Yes     Care Coordination Interventions:  Yes, provided  Interventions Today    Flowsheet Row Most Recent Value  General Interventions   General Interventions Discussed/Reviewed Community Resources  [Not sure he wants to go to [employer] d/t to being on walker and not comfortable. may pursue program offered through the sr c[facility] for caregiver/companionship]  Exercise Interventions   Exercise Discussed/Reviewed Exercise Discussed  [HHPT has ended]  Mental Health Interventions   Mental Health Discussed/Reviewed Mental Health Discussed, Coping Strategies  [pt reports he is doing fine- not comfortable getting out a lot-]  Nutrition Interventions   Nutrition Discussed/Reviewed Nutrition Discussed  [gets meals on wheels]       Follow up plan: Follow up call scheduled for 09/01/23    Encounter Outcome:  Patient Visit Completed

## 2023-08-05 DIAGNOSIS — H26492 Other secondary cataract, left eye: Secondary | ICD-10-CM | POA: Diagnosis not present

## 2023-08-29 ENCOUNTER — Encounter: Payer: Self-pay | Admitting: Podiatry

## 2023-08-29 ENCOUNTER — Ambulatory Visit: Payer: Medicare Other | Admitting: Podiatry

## 2023-08-29 DIAGNOSIS — M79675 Pain in left toe(s): Secondary | ICD-10-CM | POA: Diagnosis not present

## 2023-08-29 DIAGNOSIS — B351 Tinea unguium: Secondary | ICD-10-CM | POA: Diagnosis not present

## 2023-08-29 DIAGNOSIS — I739 Peripheral vascular disease, unspecified: Secondary | ICD-10-CM

## 2023-08-29 DIAGNOSIS — M79674 Pain in right toe(s): Secondary | ICD-10-CM | POA: Diagnosis not present

## 2023-08-29 NOTE — Progress Notes (Signed)
  Subjective:  Patient ID: [name], male    DOB: 06-29-1933,  MRN: [id number]  Chief Complaint  Patient presents with   Routine Foot Care    Nail Care    89 y.o. male presents with the above complaint. History confirmed with patient. Patient presenting with pain related to dystrophic thickened elongated nails. Patient is unable to trim own nails related to nail dystrophy and/or mobility issues. Patient does not have a history of T2DM.   Objective:  Physical Exam: warm, good capillary refill nail exam onychomycosis of the toenails, onycholysis, and dystrophic nails DP pulses palpable, PT pulses palpable, and protective sensation intact Edema of left lower extremity Left Foot:  Pain with palpation of nails due to elongation and dystrophic growth.  Right Foot: Pain with palpation of nails due to elongation and dystrophic growth.   Assessment:   1. Pain due to onychomycosis of toenails of both feet   2. PVD (peripheral vascular disease) (HCC)       Plan:  Patient was evaluated and treated and all questions answered.  #Onychomycosis with pain  -Nails palliatively debrided as below. -Educated on self-care  Procedure: Nail Debridement Rationale: Pain Type of Debridement: manual, sharp debridement. Instrumentation: Nail nipper, rotary burr. Number of Nails: 10  Return in about 3 months (around 11/29/2023) for RFC.         Corinna Gab, DPM [facility] / [facility]

## 2023-09-01 ENCOUNTER — Encounter: Payer: Self-pay | Admitting: *Deleted

## 2023-09-02 ENCOUNTER — Telehealth: Payer: Self-pay | Admitting: *Deleted

## 2023-09-02 NOTE — Patient Outreach (Signed)
  Care Coordination   09/02/2023 Name: [name] MRN: [id number] DOB: September 22, 1933   Care Coordination Outreach Attempts:  An unsuccessful telephone outreach was attempted today to offer the patient information about available care coordination services.  Follow Up Plan:  Additional outreach attempts will be made to offer the patient care coordination information and services.   Encounter Outcome:  No Answer   Care Coordination Interventions:  No, not indicated    Reece Levy, MSW, LCSW Clinical Social Worker [phone]

## 2023-09-05 ENCOUNTER — Telehealth: Payer: Self-pay | Admitting: *Deleted

## 2023-09-05 NOTE — Progress Notes (Unsigned)
  Care Coordination Note  09/05/2023 Name: [name] MRN: [id number] DOB: 06/25/1933  [name] is a 89 y.o. year old male who is a primary care patient of Paulina Fusi, MD and is actively engaged with the care management team. I reached out to [name] by phone today to assist with re-scheduling a follow up visit with the Licensed Clinical Social Worker  Follow up plan: Unsuccessful telephone outreach attempt made. A HIPAA compliant phone message was left for the patient providing contact information and requesting a return call.   Burman Nieves, CCMA Care Coordination Care Guide Direct Dial: [phone]

## 2023-09-07 NOTE — Progress Notes (Signed)
  Care Coordination Note  09/07/2023 Name: [name] MRN: [id number] DOB: Jan 10, 1933  [name] is a 89 y.o. year old male who is a primary care patient of Paulina Fusi, MD and is actively engaged with the care management team. I reached out to [name] by phone today to assist with re-scheduling a follow up visit with the Licensed Clinical Social Worker  Follow up plan: Telephone appointment with care management team member scheduled for:09/13/2023  Burman Nieves, [facility] Care Coordination Care Guide Direct Dial: [phone]

## 2023-09-13 ENCOUNTER — Ambulatory Visit: Payer: Self-pay | Admitting: *Deleted

## 2023-09-13 NOTE — Patient Instructions (Signed)
Visit Information  Thank you for taking time to visit with me today. Please don't hesitate to contact me if I can be of assistance to you.   Following are the goals we discussed today:   Goals Addressed             This Visit's Progress    Improve mental health wellness       Activities and task to complete in order to accomplish goals.   Find some ways to reach out to others-  You have decided not to move forward with counseling and would like to work with me on brief coping skills to assist you with managing   Self Support options  (Call friends/family, consider activities at the [facility] and elsewhere to decrease loneliness) Follow up on the caregiver/companionship program offered to you by meals on wheels delivery Find out more about the Veteran's Aid and Attendance program to see if you qualify [phone]         Our next appointment is by telephone on 10/06/23  Please call the care guide team at [phone] if you need to cancel or reschedule your appointment.   If you are experiencing a Mental Health or Behavioral Health Crisis or need someone to talk to, please call the Suicide and Crisis Lifeline: 988 call 911   The patient verbalized understanding of instructions, educational materials, and care plan provided today and DECLINED offer to receive copy of patient instructions, educational materials, and care plan.   Telephone follow up appointment with care management team member scheduled for:10/06/23  Reece Levy, MSW, LCSW [facility]  [facility], [facility] Licensed Clinical Social Worker Care Coordinator  [phone]

## 2023-09-13 NOTE — Patient Outreach (Signed)
  Care Coordination   Follow Up Visit Note   09/13/2023 Name: [name] MRN: [id number] DOB: 10/11/1933  [name] is a 89 y.o. year old male who sees Paulina Fusi, MD for primary care. I spoke with  [name] by phone today.  What matters to the patients health and wellness today?  "I'm doing.... lonely but doing ok"    Goals Addressed             This Visit's Progress    Improve mental health wellness       Activities and task to complete in order to accomplish goals.   Find some ways to reach out to others-  You have decided not to move forward with counseling and would like to work with me on brief coping skills to assist you with managing   Self Support options  (Call friends/family, consider activities at the [facility] and elsewhere to decrease loneliness) Follow up on the caregiver/companionship program offered to you by meals on wheels delivery Find out more about the Veteran's Aid and Attendance program to see if you qualify [phone]         SDOH assessments and interventions completed:  Yes     Care Coordination Interventions:  Yes, provided  Interventions Today    Flowsheet Row Most Recent Value  Chronic Disease   Chronic disease during today's visit Other  [lonely]  General Interventions   General Interventions Discussed/Reviewed [facility], Doctor Visits  Doctor Visits Discussed/Reviewed Specialist  [Pt going to see Neurologist tomorrow (vertigo). Pt feels he is also dealing with some "shaky and nerves are shot".]  Mental Health Interventions   Mental Health Discussed/Reviewed Other  [denies SI]  Nutrition Interventions   Nutrition Discussed/Reviewed Nutrition Discussed  [Meals on Wheels is delivered 5 days weekly]  Safety Interventions   Safety Discussed/Reviewed Home Safety  Home Safety Assistive Devices  [Pt uses a walker and has a life alert. Pt advised of the "988" number to call if needs mental health support.]        Follow up plan: Follow up call scheduled for 10/06/23    Encounter Outcome:  Patient Visit Completed

## 2023-09-14 DIAGNOSIS — R42 Dizziness and giddiness: Secondary | ICD-10-CM | POA: Diagnosis not present

## 2023-09-14 DIAGNOSIS — H532 Diplopia: Secondary | ICD-10-CM | POA: Diagnosis not present

## 2023-09-14 DIAGNOSIS — I6501 Occlusion and stenosis of right vertebral artery: Secondary | ICD-10-CM | POA: Diagnosis not present

## 2023-09-14 DIAGNOSIS — Z8673 Personal history of transient ischemic attack (TIA), and cerebral infarction without residual deficits: Secondary | ICD-10-CM | POA: Insufficient documentation

## 2023-09-14 DIAGNOSIS — M25551 Pain in right hip: Secondary | ICD-10-CM | POA: Diagnosis not present

## 2023-09-14 DIAGNOSIS — R202 Paresthesia of skin: Secondary | ICD-10-CM | POA: Diagnosis not present

## 2023-09-14 DIAGNOSIS — R200 Anesthesia of skin: Secondary | ICD-10-CM | POA: Diagnosis not present

## 2023-09-14 HISTORY — DX: Personal history of transient ischemic attack (TIA), and cerebral infarction without residual deficits: Z86.73

## 2023-09-26 ENCOUNTER — Other Ambulatory Visit: Payer: Self-pay

## 2023-09-26 NOTE — Patient Outreach (Signed)
Telephone outreach to patient to obtain mRS was successfully completed. MRS= 7  Pt was not having a stroke was having a vertigo attack.   Vanice Sarah [facility] Care Management Assistant [phone]

## 2023-10-06 ENCOUNTER — Encounter: Payer: Self-pay | Admitting: *Deleted

## 2023-10-07 ENCOUNTER — Ambulatory Visit: Payer: Self-pay | Admitting: *Deleted

## 2023-10-07 NOTE — Patient Outreach (Signed)
  Care Coordination   Follow Up Visit Note   10/07/2023 Name: SHIGEKI FURNO MRN: [id number] DOB: August 03, 1933  JOSIAHA PICKMAN is a 89 y.o. year old male who sees Paulina Fusi, MD for primary care. I spoke with  [name] by phone today.  What matters to the patients health and wellness today?  Going to see an ENT     Goals Addressed             This Visit's Progress    Improve mental health wellness       Activities and task to complete in order to accomplish goals.   Follow up on hearing aids and ENT visit  Find some ways to reach out to others-  You have decided not to move forward with counseling and would like to work with me on brief coping skills to assist you with managing   Self Support options  (Call friends/family, consider activities at the [facility] and elsewhere to decrease loneliness) Follow up on the caregiver/companionship program offered to you by meals on wheels delivery Find out more about the Veteran's Aid and Attendance program to see if you qualify [phone]         SDOH assessments and interventions completed:  Yes     Care Coordination Interventions:  Yes, provided  Interventions Today    Flowsheet Row Most Recent Value  Chronic Disease   Chronic disease during today's visit Other  [?meniere's disease]  General Interventions   General Interventions Discussed/Reviewed Doctor Visits  Doctor Visits Discussed/Reviewed Doctor Visits Discussed  [Saw Neurologist who recommended seeing an ENT- scheduled for mid- December. ?Mnire's disease]  Mental Health Interventions   Mental Health Discussed/Reviewed Mental Health Discussed, Mental Health Reviewed, Coping Strategies  [Pt continues to feel lonely at times. he is not comfortable going out in public (to sr center, etc because of walker use) Denies SI and reminded of 988 hotline]       Follow up plan: Follow up call scheduled for 11/14/23    Encounter Outcome:  Patient Visit Completed

## 2023-10-07 NOTE — Patient Instructions (Signed)
Visit Information  Thank you for taking time to visit with me today. Please don't hesitate to contact me if I can be of assistance to you.   Following are the goals we discussed today:   Goals Addressed             This Visit's Progress    Improve mental health wellness       Activities and task to complete in order to accomplish goals.   Follow up on hearing aids and ENT visit  Find some ways to reach out to others-  You have decided not to move forward with counseling and would like to work with me on brief coping skills to assist you with managing   Self Support options  (Call friends/family, consider activities at the [facility] and elsewhere to decrease loneliness) Follow up on the caregiver/companionship program offered to you by meals on wheels delivery Find out more about the Veteran's Aid and Attendance program to see if you qualify [phone]         Our next appointment is by telephone on 11/14/23  Please call the care guide team at [phone] if you need to cancel or reschedule your appointment.   If you are experiencing a Mental Health or Behavioral Health Crisis or need someone to talk to, please call the Suicide and Crisis Lifeline: 988 call 911   The patient verbalized understanding of instructions, educational materials, and care plan provided today and DECLINED offer to receive copy of patient instructions, educational materials, and care plan.   Telephone follow up appointment with care management team member scheduled for: 11/14/23  Reece Levy, MSW, LCSW [place]  [facility], [facility] Licensed Clinical Social Worker Care Coordinator  [phone]

## 2023-11-10 DIAGNOSIS — H903 Sensorineural hearing loss, bilateral: Secondary | ICD-10-CM | POA: Diagnosis not present

## 2023-11-14 ENCOUNTER — Ambulatory Visit: Payer: Self-pay | Admitting: *Deleted

## 2023-11-14 NOTE — Patient Outreach (Addendum)
  Care Coordination   Follow Up Visit Note   11/14/2023 Name: [name] MRN: [id number] DOB: 10-05-1933  [name] is a 89 y.o. year old male who sees Paulina Fusi, MD for primary care. I spoke with  [name] by phone today.  What matters to the patients health and wellness today?  Still having some dizziness and weakness.     Goals Addressed             This Visit's Progress    COMPLETED: Improve mental health wellness       Activities and task to complete in order to accomplish goals.   Follow up on hearing aids and ENT visit  Find some ways to reach out to others-  You have decided not to move forward with counseling and would like to work with me on brief coping skills to assist you with managing   Self Support options  (Call friends/family, consider activities at the [facility] and elsewhere to decrease loneliness) Follow up on the caregiver/companionship program offered to you by meals on wheels delivery Find out more about the Veteran's Aid and Attendance program to see if you qualify [phone]         SDOH assessments and interventions completed:  Yes     Care Coordination Interventions:  Yes, provided  Interventions Today    Flowsheet Row Most Recent Value  Chronic Disease   Chronic disease during today's visit Other  [chronic pain']  General Interventions   General Interventions Discussed/Reviewed [employer] calling [facility] to seek help at home]  Level of Care Assisted Living  [Discussed  possible moving to retirement community/ALF- not certain he wants to consider this-]  Exercise Interventions   Exercise Discussed/Reviewed Exercise Discussed  ["hurt too much to exercise"]  Mental Health Interventions   Mental Health Discussed/Reviewed Mental Health Discussed, Coping Strategies  [Pt in good spirits- denies further needs.]       Follow up plan: No further intervention required.   Encounter Outcome:  Patient Visit  Completed

## 2023-11-22 DIAGNOSIS — M21372 Foot drop, left foot: Secondary | ICD-10-CM | POA: Diagnosis not present

## 2023-11-22 DIAGNOSIS — R200 Anesthesia of skin: Secondary | ICD-10-CM | POA: Diagnosis not present

## 2023-11-22 DIAGNOSIS — R7301 Impaired fasting glucose: Secondary | ICD-10-CM | POA: Diagnosis not present

## 2023-11-22 DIAGNOSIS — I10 Essential (primary) hypertension: Secondary | ICD-10-CM | POA: Diagnosis not present

## 2023-11-22 DIAGNOSIS — R202 Paresthesia of skin: Secondary | ICD-10-CM | POA: Diagnosis not present

## 2023-11-22 DIAGNOSIS — S39011A Strain of muscle, fascia and tendon of abdomen, initial encounter: Secondary | ICD-10-CM | POA: Diagnosis not present

## 2023-11-22 DIAGNOSIS — E785 Hyperlipidemia, unspecified: Secondary | ICD-10-CM | POA: Diagnosis not present

## 2023-11-22 DIAGNOSIS — Z86718 Personal history of other venous thrombosis and embolism: Secondary | ICD-10-CM | POA: Diagnosis not present

## 2023-12-01 ENCOUNTER — Ambulatory Visit: Payer: Medicare Other | Admitting: Podiatry

## 2023-12-01 DIAGNOSIS — M79674 Pain in right toe(s): Secondary | ICD-10-CM

## 2023-12-01 DIAGNOSIS — I739 Peripheral vascular disease, unspecified: Secondary | ICD-10-CM | POA: Diagnosis not present

## 2023-12-01 DIAGNOSIS — B351 Tinea unguium: Secondary | ICD-10-CM | POA: Diagnosis not present

## 2023-12-01 DIAGNOSIS — M79675 Pain in left toe(s): Secondary | ICD-10-CM | POA: Diagnosis not present

## 2023-12-01 NOTE — Progress Notes (Signed)
    Subjective:  Patient ID: [name], male    DOB: 09/13/1933,  MRN: [id number]  [name] presents to clinic today for:  Chief Complaint  Patient presents with   Foot Care    Not diabetic. Takes ASA 81 mg. Denies corns/callus.    Patient notes nails are thick and elongated, causing pain in shoe gear when ambulating.  Patient states he was told he has neuropathy in his feet by a neurologist in the past.  He uses a walker to assist with ambulation.  He had a left ankle replacement and ORIF of a right ankle fracture years ago.  PCP is Keren Vicenta BRAVO, MD. last seen 06/22/2023  Past Medical History:  Diagnosis Date   Acid reflux    Arthritis    right ankle (05/11/2013)   Chronic lower back pain    Enlarged prostate    Hard of hearing    History of kidney stones    Hypercholesteremia    Hypertension    Sleep apnea    had study; then OR; never wore mask (05/11/2013)   Stroke [facility]) 1979   fully recovered since then (05/11/2013)   Vertigo    present intermittently for 40+ years    No Known Allergies  Objective: [name] is a pleasant 89 y.o. male in NAD. AAO x 3.  Vascular Examination: Patient has palpable DP pulse, absent PT pulse bilateral.  Delayed capillary refill bilateral toes.  Sparse digital hair bilateral.  Proximal to distal cooling WNL bilateral.    Dermatological Examination: Interspaces are clear with no open lesions noted bilateral.  Skin is shiny and atrophic bilateral.  Nails are 3-88mm thick, with yellowish/brown discoloration, subungual debris and distal onycholysis x10.  There is pain with compression of nails x10.       Latest Ref Rng & Units 06/16/2023    3:46 PM  Hemoglobin A1C  Hemoglobin-A1c 4.8 - 5.6 % 5.9    Patient qualifies for at-risk foot care because of PVD.  Assessment/Plan: 1. Pain due to onychomycosis of toenails of both feet   2. PVD (peripheral vascular disease) (HCC)    Mycotic nails x10 were sharply debrided with  sterile nail nippers and power debriding burr to decrease bulk and length.  Return in about 3 months (around 02/29/2024) for RFC.   Awanda CHARM Imperial, DPM, FACFAS [facility]     [address] [place], [place] [address]                Office [phone]  Fax [phone]

## 2023-12-29 DIAGNOSIS — H905 Unspecified sensorineural hearing loss: Secondary | ICD-10-CM | POA: Diagnosis not present

## 2024-01-06 DIAGNOSIS — R109 Unspecified abdominal pain: Secondary | ICD-10-CM | POA: Diagnosis not present

## 2024-01-13 DIAGNOSIS — R109 Unspecified abdominal pain: Secondary | ICD-10-CM | POA: Diagnosis not present

## 2024-01-19 DIAGNOSIS — H903 Sensorineural hearing loss, bilateral: Secondary | ICD-10-CM | POA: Diagnosis not present

## 2024-02-21 DIAGNOSIS — M25551 Pain in right hip: Secondary | ICD-10-CM | POA: Diagnosis not present

## 2024-02-21 DIAGNOSIS — G8929 Other chronic pain: Secondary | ICD-10-CM | POA: Diagnosis not present

## 2024-02-29 DIAGNOSIS — M25551 Pain in right hip: Secondary | ICD-10-CM | POA: Diagnosis not present

## 2024-03-01 ENCOUNTER — Ambulatory Visit: Payer: Medicare Other | Admitting: Podiatry

## 2024-03-01 DIAGNOSIS — M79675 Pain in left toe(s): Secondary | ICD-10-CM | POA: Diagnosis not present

## 2024-03-01 DIAGNOSIS — M19072 Primary osteoarthritis, left ankle and foot: Secondary | ICD-10-CM

## 2024-03-01 DIAGNOSIS — M2042 Other hammer toe(s) (acquired), left foot: Secondary | ICD-10-CM | POA: Diagnosis not present

## 2024-03-01 DIAGNOSIS — B351 Tinea unguium: Secondary | ICD-10-CM

## 2024-03-01 DIAGNOSIS — M19071 Primary osteoarthritis, right ankle and foot: Secondary | ICD-10-CM

## 2024-03-01 DIAGNOSIS — M79674 Pain in right toe(s): Secondary | ICD-10-CM | POA: Diagnosis not present

## 2024-03-01 NOTE — Progress Notes (Signed)
 Subjective:  Patient ID: [name], male    DOB: [birth year],  MRN: [id number]  [name] presents to clinic today for:  Chief Complaint  Patient presents with   [facility]    RFC with out callous. There are 3 little spots on the lateral side of the right foot. He is worried about what they are. Not diabetic and takes ASA 81   Patient notes nails are thick, discolored, elongated and painful in shoegear when trying to ambulate.  He also notes some pain to his left second hammertoe.  States that it is intermittent.  Denies any significant redness or swelling to the area.  Patient also is wondering if his ankle replacement and ankle fusion hardware are holding up well.  He would like to have this x-rayed and reviewed in the near future.  PCP is Adrian Hopper, MD.  Past Medical History:  Diagnosis Date   Acid reflux    Arthritis    "right ankle" (05/11/2013)   Chronic lower back pain    Enlarged prostate    Hard of hearing    History of kidney stones    Hypercholesteremia    Hypertension    Sleep apnea    "had study; then OR; never wore mask" (05/11/2013)   Stroke [facility]) 1979   "fully recovered since then" (05/11/2013)   Vertigo    present intermittently for 40+ years    Past Surgical History:  Procedure Laterality Date   ANKLE FUSION Right 05/10/2013   Procedure: ARTHRODESIS ANKLE;  Surgeon: Amada Backer, MD;  Location: [facility] OR;  Service: Orthopedics;  Laterality: Right;   BACK SURGERY  11/22/2009   CARPAL TUNNEL RELEASE Right 04/15/2010   LITHOTRIPSY  ~ 2009   NASAL SINUS SURGERY Bilateral 11/22/2002   POSTERIOR LAMINECTOMY / DECOMPRESSION LUMBAR SPINE  11/22/2009   Dr. Mevelyn Acton   REPAIR OF PERFORATED ULCER  11/22/1977   STOMACH SURGERY     TENDON RELEASE Right 05/10/2013   Procedure: PERCUTANEOUS ACHILLES LENGTHENING;  Surgeon: Amada Backer, MD;  Location: [facility] OR;  Service: Orthopedics;  Laterality: Right;   TONSILLECTOMY  07/24/1939   TOTAL ANKLE ARTHROPLASTY Left  03/14/2018   Procedure: TOTAL ANKLE ARTHOPLASTY;  Surgeon: Amada Backer, MD;  Location: [facility] OR;  Service: Orthopedics;  Laterality: Left;    No Known Allergies  Review of Systems: Negative except as noted in the HPI.  Objective:  [name] is a pleasant 89 y.o. male in NAD. AAO x 3.  Vascular Examination: Capillary refill time is 3-5 seconds to toes bilateral. Palpable pedal pulses b/l LE. Digital hair present b/l.  Skin temperature gradient WNL b/l. No varicosities b/l. No cyanosis noted b/l.   Dermatological Examination: Pedal skin with normal turgor, texture and tone b/l. No open wounds. No interdigital macerations b/l. Toenails x10 are 3mm thick, discolored, dystrophic with subungual debris. There is pain with compression of the nail plates.  They are elongated x10  Musculoskeletal Examination: Muscle strength 5/5 to all LE muscle groups b/l.  Decreased range of motion of ankle bilateral.  Semirigid contracture of the left second toe at the PIP joint.  Pain on palpation of the PIP joint     Latest Ref Rng & Units 06/16/2023    3:46 PM  Hemoglobin A1C  Hemoglobin-A1c 4.8 - 5.6 % 5.9    Assessment/Plan: 1. Pain due to onychomycosis of toenails of both feet   2. Hammertoe of left foot   3. Arthritis  of both ankles    The mycotic toenails were sharply debrided x10 with sterile nail nippers and a power debriding burr to decrease bulk/thickness and length.    Recommended the patient massage Voltaren gel, or Bengay, or Aspercreme to the PIP joint of the left second toe as needed for arthritis pain.  Recommend the patient to schedule a separate appointment to evaluate the ankles and allow extra time to perform bilateral ankle x-rays.  He states he will just schedule this at his next nail care appointment.  Return in about 3 months (around 05/31/2024) for RFC and XR b/l ankles.   Joe Murders, DPM, FACFAS [facility]     [address] [place], [place] [address]                Office [phone]  Fax [phone]

## 2024-03-21 DIAGNOSIS — H524 Presbyopia: Secondary | ICD-10-CM | POA: Diagnosis not present

## 2024-03-21 DIAGNOSIS — H353132 Nonexudative age-related macular degeneration, bilateral, intermediate dry stage: Secondary | ICD-10-CM | POA: Diagnosis not present

## 2024-04-12 DIAGNOSIS — M5416 Radiculopathy, lumbar region: Secondary | ICD-10-CM | POA: Diagnosis not present

## 2024-04-12 DIAGNOSIS — K219 Gastro-esophageal reflux disease without esophagitis: Secondary | ICD-10-CM | POA: Diagnosis not present

## 2024-04-12 DIAGNOSIS — I10 Essential (primary) hypertension: Secondary | ICD-10-CM | POA: Diagnosis not present

## 2024-04-26 DIAGNOSIS — M5416 Radiculopathy, lumbar region: Secondary | ICD-10-CM | POA: Diagnosis not present

## 2024-04-26 DIAGNOSIS — M48061 Spinal stenosis, lumbar region without neurogenic claudication: Secondary | ICD-10-CM | POA: Diagnosis not present

## 2024-05-03 DIAGNOSIS — N390 Urinary tract infection, site not specified: Secondary | ICD-10-CM | POA: Diagnosis not present

## 2024-05-23 DIAGNOSIS — I10 Essential (primary) hypertension: Secondary | ICD-10-CM | POA: Diagnosis not present

## 2024-05-23 DIAGNOSIS — M5416 Radiculopathy, lumbar region: Secondary | ICD-10-CM | POA: Diagnosis not present

## 2024-05-23 DIAGNOSIS — E785 Hyperlipidemia, unspecified: Secondary | ICD-10-CM | POA: Diagnosis not present

## 2024-05-23 DIAGNOSIS — R7301 Impaired fasting glucose: Secondary | ICD-10-CM | POA: Diagnosis not present

## 2024-05-23 DIAGNOSIS — R109 Unspecified abdominal pain: Secondary | ICD-10-CM | POA: Diagnosis not present

## 2024-05-23 DIAGNOSIS — Z86718 Personal history of other venous thrombosis and embolism: Secondary | ICD-10-CM | POA: Diagnosis not present

## 2024-05-31 ENCOUNTER — Ambulatory Visit (INDEPENDENT_AMBULATORY_CARE_PROVIDER_SITE_OTHER)

## 2024-05-31 ENCOUNTER — Ambulatory Visit: Admitting: Podiatry

## 2024-05-31 DIAGNOSIS — M19071 Primary osteoarthritis, right ankle and foot: Secondary | ICD-10-CM

## 2024-05-31 DIAGNOSIS — B351 Tinea unguium: Secondary | ICD-10-CM | POA: Diagnosis not present

## 2024-05-31 DIAGNOSIS — M79675 Pain in left toe(s): Secondary | ICD-10-CM

## 2024-05-31 DIAGNOSIS — M19072 Primary osteoarthritis, left ankle and foot: Secondary | ICD-10-CM

## 2024-05-31 DIAGNOSIS — M79674 Pain in right toe(s): Secondary | ICD-10-CM | POA: Diagnosis not present

## 2024-05-31 NOTE — Progress Notes (Signed)
 Subjective:  Patient ID: [name], male    DOB: 06-19-1933,  MRN: [id number]  [name] presents to clinic today for:  Chief Complaint  Patient presents with   [name]    Not diabetic. Takes asa 81 mg. Will come back next week for bil. Ankle XR.    Patient notes nails are thick, discolored, elongated and painful in shoegear when trying to ambulate.  He also presents to have x-rays taken of the bilateral ankles to evaluate previous injuries as he would like to make sure his hardware is in good position.  He has had multiple surgeries in the past.  He also is wondering if there is something he can put between the left 1st and 2nd toes to push the big toe away from the second toe.  PCP is Colton Vicenta BRAVO, MD.  Past Medical History:  Diagnosis Date   Acid reflux    Arthritis    right ankle (05/11/2013)   Chronic lower back pain    Enlarged prostate    Hard of hearing    History of kidney stones    Hypercholesteremia    Hypertension    Sleep apnea    had study; then OR; never wore mask (05/11/2013)   Stroke [facility]) 1979   fully recovered since then (05/11/2013)   Vertigo    present intermittently for 40+ years   Past Surgical History:  Procedure Laterality Date   ANKLE FUSION Right 05/10/2013   Procedure: ARTHRODESIS ANKLE;  Surgeon: Norleen Armor, MD;  Location[facility] OR;  Service: Orthopedics;  Laterality: Right;   BACK SURGERY  11/22/2009   CARPAL TUNNEL RELEASE Right 04/15/2010   LITHOTRIPSY  ~ 2009   NASAL SINUS SURGERY Bilateral 11/22/2002   POSTERIOR LAMINECTOMY / DECOMPRESSION LUMBAR SPINE  11/22/2009   Dr. Amy   REPAIR OF PERFORATED ULCER  11/22/1977   STOMACH SURGERY     TENDON RELEASE Right 05/10/2013   Procedure: PERCUTANEOUS ACHILLES LENGTHENING;  Surgeon: Norleen Armor, MD;  Location: [facility] OR;  Service: Orthopedics;  Laterality: Right;   TONSILLECTOMY  07/24/1939   TOTAL ANKLE ARTHROPLASTY Left 03/14/2018   Procedure: TOTAL ANKLE ARTHOPLASTY;   Surgeon: Armor Norleen, MD;  Location: [facility] OR;  Service: Orthopedics;  Laterality: Left;   No Known Allergies  Review of Systems: Negative except as noted in the HPI.  Objective:  [name] is a pleasant 89 y.o. male in NAD. AAO x 3.  Vascular Examination: Capillary refill time is 3-5 seconds to toes bilateral. Palpable pedal pulses b/l LE. Digital hair present b/l.  Skin temperature gradient WNL b/l. No varicosities b/l. No cyanosis noted b/l.   Dermatological Examination: Pedal skin with normal turgor, texture and tone b/l. No open wounds. No interdigital macerations b/l. Toenails x10 are 3mm thick, discolored, dystrophic with subungual debris. There is pain with compression of the nail plates.  They are elongated x10  Orthopedic examination: Stiffness in the bilateral ankle and subtalar joints.  Mild discomfort with range of motion of these joints.  No crepitus noted.  Ankle dorsiflexion less than 10 degrees with the knee extended bilateral  Radiographic examination (bilateral ankle weightbearing radiographs, 05/31/2024): Decreased osseous mineralization bilateral.  One of the horizontal small cortical screws in the right ankle is broken.  It appears to be broken within the tibia, and therefore stable.  The arthrodesis site of the right ankle is intact with fusion maintained.  The left ankle replacement hardware is intact.  Small inferior calcaneal spur present.     Latest Ref Rng & Units 06/16/2023    3:46 PM  Hemoglobin A1C  Hemoglobin-A1c 4.8 - 5.6 % 5.9    Assessment/Plan: 1. Pain due to onychomycosis of toenails of both feet   2. Arthritis of both ankles    The mycotic toenails were sharply debrided x10 with sterile nail nippers and a power debriding burr to decrease bulk/thickness and length.    Will discuss the radiographic findings with the patient post visit due to time constraints.  Return in about 3 months (around 08/31/2024) for RFC.   [name], DPM,  FACFAS [facility]     [address] [place], [place] [address]                Office [phone]  Fax [phone]

## 2024-06-04 NOTE — Progress Notes (Signed)
 Patient informed of XR results. He mentioned that he may go see Careers adviser.

## 2024-06-07 ENCOUNTER — Ambulatory Visit: Admitting: Podiatry

## 2024-06-29 DIAGNOSIS — M48061 Spinal stenosis, lumbar region without neurogenic claudication: Secondary | ICD-10-CM | POA: Diagnosis not present

## 2024-06-29 DIAGNOSIS — M4316 Spondylolisthesis, lumbar region: Secondary | ICD-10-CM | POA: Diagnosis not present

## 2024-08-01 DIAGNOSIS — H04123 Dry eye syndrome of bilateral lacrimal glands: Secondary | ICD-10-CM | POA: Diagnosis not present

## 2024-08-01 DIAGNOSIS — H353132 Nonexudative age-related macular degeneration, bilateral, intermediate dry stage: Secondary | ICD-10-CM | POA: Diagnosis not present

## 2024-08-25 DIAGNOSIS — Z7982 Long term (current) use of aspirin: Secondary | ICD-10-CM | POA: Diagnosis not present

## 2024-08-25 DIAGNOSIS — R079 Chest pain, unspecified: Secondary | ICD-10-CM | POA: Diagnosis not present

## 2024-08-25 DIAGNOSIS — I4589 Other specified conduction disorders: Secondary | ICD-10-CM | POA: Diagnosis not present

## 2024-08-25 DIAGNOSIS — Z87891 Personal history of nicotine dependence: Secondary | ICD-10-CM | POA: Diagnosis not present

## 2024-08-25 DIAGNOSIS — I444 Left anterior fascicular block: Secondary | ICD-10-CM | POA: Diagnosis not present

## 2024-08-25 DIAGNOSIS — E785 Hyperlipidemia, unspecified: Secondary | ICD-10-CM | POA: Diagnosis not present

## 2024-08-25 DIAGNOSIS — R072 Precordial pain: Secondary | ICD-10-CM | POA: Diagnosis not present

## 2024-08-25 DIAGNOSIS — R0602 Shortness of breath: Secondary | ICD-10-CM | POA: Diagnosis not present

## 2024-08-25 DIAGNOSIS — I10 Essential (primary) hypertension: Secondary | ICD-10-CM | POA: Diagnosis not present

## 2024-08-25 DIAGNOSIS — Z888 Allergy status to other drugs, medicaments and biological substances status: Secondary | ICD-10-CM | POA: Diagnosis not present

## 2024-08-25 DIAGNOSIS — R9431 Abnormal electrocardiogram [ECG] [EKG]: Secondary | ICD-10-CM | POA: Diagnosis not present

## 2024-08-25 DIAGNOSIS — Z79899 Other long term (current) drug therapy: Secondary | ICD-10-CM | POA: Diagnosis not present

## 2024-08-25 DIAGNOSIS — M1990 Unspecified osteoarthritis, unspecified site: Secondary | ICD-10-CM | POA: Diagnosis not present

## 2024-08-25 DIAGNOSIS — R0789 Other chest pain: Secondary | ICD-10-CM | POA: Diagnosis not present

## 2024-08-25 DIAGNOSIS — K219 Gastro-esophageal reflux disease without esophagitis: Secondary | ICD-10-CM | POA: Diagnosis not present

## 2024-08-25 DIAGNOSIS — I7121 Aneurysm of the ascending aorta, without rupture: Secondary | ICD-10-CM | POA: Diagnosis not present

## 2024-08-25 DIAGNOSIS — Z66 Do not resuscitate: Secondary | ICD-10-CM | POA: Diagnosis not present

## 2024-08-25 DIAGNOSIS — I4510 Unspecified right bundle-branch block: Secondary | ICD-10-CM | POA: Diagnosis not present

## 2024-08-25 DIAGNOSIS — Z23 Encounter for immunization: Secondary | ICD-10-CM | POA: Diagnosis not present

## 2024-08-25 DIAGNOSIS — Z8673 Personal history of transient ischemic attack (TIA), and cerebral infarction without residual deficits: Secondary | ICD-10-CM | POA: Diagnosis not present

## 2024-08-26 DIAGNOSIS — I10 Essential (primary) hypertension: Secondary | ICD-10-CM | POA: Diagnosis not present

## 2024-08-26 DIAGNOSIS — R0602 Shortness of breath: Secondary | ICD-10-CM | POA: Diagnosis not present

## 2024-08-26 DIAGNOSIS — R079 Chest pain, unspecified: Secondary | ICD-10-CM | POA: Diagnosis not present

## 2024-08-30 ENCOUNTER — Ambulatory Visit: Admitting: Podiatry

## 2024-09-03 NOTE — Progress Notes (Unsigned)
 Cardiology Office Note:    Date:  09/05/2024   ID:  [name], DOB 26-Feb-1933, MRN [id number]  PCP:  Keren Vicenta BRAVO, MD  Cardiologist:  Redell Leiter, MD   Referring MD: Keren Vicenta BRAVO, MD  ASSESSMENT:    1. Chest pain of uncertain etiology   2. Primary hypertension   3. Mixed hyperlipidemia    PLAN:    In order of problems listed above:  In retrospect I think he had an episode of typical angina associated with emotional distress.  No evidence of acute coronary syndrome.  With his comorbidities I would favor medical treatment continue aspirin  as high intensity statin beta-blocker calcium  channel blocker and given a prescription for nitroglycerin Initially I thought he had an ischemia evaluation he did not will contact the son he should have a perfusion study my office Hypertension is stable continue his current treatment Lipids at target continue his high intensity statin  Next appointment1 yaer some   Medication Adjustments/Labs and Tests Ordered: Current medicines are reviewed at length with the patient today.  Concerns regarding medicines are outlined above.  Orders Placed This Encounter  Procedures   EKG 12-Lead   Meds ordered this encounter  Medications   nitroGLYCERIN (NITROSTAT) 0.4 MG SL tablet    Sig: Place 1 tablet (0.4 mg total) under the tongue every 5 (five) minutes as needed for chest pain.    Dispense:  25 tablet    Refill:  1     No chief complaint on file.   History of Present Illness:    [name] is a 89 y.o. male who is being seen today for the evaluation of chest pain at the request of Keren Vicenta BRAVO, MD.  He has a history of suspected stroke stroke treated with fibrinolytic therapy with subsequent MRI showing no findings of acute infarction.  Other problems include hypertension hyperlipidemia and mild cognitive dysfunction.  Echocardiogram showed normal left ventricular function at that time.  Lipid profile 05/24/2024  cholesterol total 115 LDL 55 non-HDL cholesterol 71 A1c 5.8% hemoglobin 14.3 platelets 249,000 sodium 147 potassium 4.5 creatinine 0.98 GFR 74 cc/min alkaline phosphatase was elevated normal transaminases.  He was seen at [facility] ED 08/25/2024 for chest pain.  CBC with a hemoglobin of 13.3 platelets 231,000 creatinine 0.9 troponin undetectable BNP level low 141 CTA of the chest showed mild fusiform enlargement ascending aorta 41 mm interstitial lung disease and coronary artery calcification subsequently admitted to the hospital repeat troponin remained undetectable he did not have an inpatient evaluation for ischemia.  EKG independently reviewed showed sinus rhythm RSR prime V1 and V2 left axis deviation left anterior hemiblock versus old inferior infarction.  His son politely shows me a note that his father's episode of chest discomfort and shortness of breath occurred and received a call that his best friend had passed. In retrospect I think he had typical angina He will be given a prescription for nitroglycerin to use on a as needed basis I do not think he needs a repeat cardiac imaging He voiced concerned about his aorta being enlarged that is common in his age group and I do not think he requires any intervention or repeat imaging for his thoracic aorta.  When I saw him I thought he had previously undergone a chest pain evaluation he had not I am going to contact the son I think he should come back to our office to have them your perfusion study for prognosis Past Medical History:  Diagnosis Date   Acid reflux    Acquired pes planus of both feet 08/26/2016   Ankle arthritis 05/06/2016   Arthritis    right ankle (05/11/2013)   Cerebrovascular accident (CVA) (HCC) 08/13/2021   Cervical radiculitis 12/23/2015   Cervical spondylosis with myelopathy 10/10/2013   Chronic lower back pain    Degeneration of intervertebral disc of lumbosacral region 04/28/2013   Degenerative lumbar spinal  stenosis 09/04/2024   Diplopia 08/13/2021   Disease of spinal cord (HCC) 10/10/2013   Dislocation of left knee with lateral meniscus tear 06/03/2016   Enlarged prostate    Gonalgia 02/24/2015   H/O total ankle replacement, left 03/14/2018   Hammertoe of left foot 12/20/2016   Hard of hearing    Hip pain 04/01/2017   History of arthroscopy of knee 03/31/2015   History of kidney stones    History of stroke 09/14/2023   Hypercholesteremia    Hypertension    Ingrowing nail 01/28/2016   Ingrown nail of great toe of left foot 01/28/2016   Inguinal pain 09/08/2017   Knee torn cartilage 03/13/2015   Left knee pain 02/24/2015   Lumbar radiculopathy 06/17/2022   Metatarsalgia, left foot 12/20/2016   Mild cognitive impairment 08/13/2021   Neck pain 05/14/2015   Plantar fat pad atrophy of left foot 12/20/2016   Ptosis, both eyelids 11/13/2014   S/P left knee arthroscopy 03/31/2015   Sleep apnea    had study; then OR; never wore mask (05/11/2013)   Spinal stenosis in cervical region 10/10/2013   Stroke [facility]) 1979   fully recovered since then (05/11/2013)   Tear of meniscus of left knee 03/13/2015   Vertigo    present intermittently for 40+ years    Past Surgical History:  Procedure Laterality Date   ANKLE FUSION Right 05/10/2013   Procedure: ARTHRODESIS ANKLE;  Surgeon: Norleen Armor, MD;  Location[facility] OR;  Service: Orthopedics;  Laterality: Right;   BACK SURGERY  11/22/2009   CARPAL TUNNEL RELEASE Right 04/15/2010   LITHOTRIPSY  ~ 2009   NASAL SINUS SURGERY Bilateral 11/22/2002   POSTERIOR LAMINECTOMY / DECOMPRESSION LUMBAR SPINE  11/22/2009   Dr. Amy   REPAIR OF PERFORATED ULCER  11/22/1977   STOMACH SURGERY     TENDON RELEASE Right 05/10/2013   Procedure: PERCUTANEOUS ACHILLES LENGTHENING;  Surgeon: Norleen Armor, MD;  Location: [facility] OR;  Service: Orthopedics;  Laterality: Right;   TONSILLECTOMY  07/24/1939   TOTAL ANKLE ARTHROPLASTY Left 03/14/2018   Procedure: TOTAL  ANKLE ARTHOPLASTY;  Surgeon: Armor Norleen, MD;  Location: [facility] OR;  Service: Orthopedics;  Laterality: Left;    Current Medications: Current Meds  Medication Sig   amLODipine  (NORVASC ) 5 MG tablet Take 5 mg by mouth daily.   aspirin  EC 81 MG tablet Take 1 tablet (81 mg total) by mouth daily. Swallow whole.   atorvastatin  (LIPITOR) 10 MG tablet Take 10 mg by mouth at bedtime.    losartan  (COZAAR ) 100 MG tablet Take 100 mg by mouth at bedtime.   meclizine  (ANTIVERT ) 12.5 MG tablet Take 1 tablet (12.5 mg total) by mouth 3 (three) times daily as needed for dizziness.   melatonin 5 MG TABS Take 5 mg by mouth at bedtime.   metoprolol  tartrate (LOPRESSOR ) 50 MG tablet Take 50 mg by mouth 2 (two) times daily.   Multiple Vitamin (MULTIVITAMIN WITH MINERALS) TABS Take 1 tablet by mouth daily.   nitroGLYCERIN (NITROSTAT) 0.4 MG SL tablet Place 1 tablet (0.4 mg total) under the tongue every  5 (five) minutes as needed for chest pain.   pantoprazole  (PROTONIX ) 40 MG tablet Take 40 mg by mouth daily.   tamsulosin  (FLOMAX ) 0.4 MG CAPS capsule Take 0.4 mg by mouth every evening.   tiZANidine (ZANAFLEX) 2 MG tablet Take 2 mg by mouth 3 (three) times daily as needed.     Allergies:   Patient has no known allergies.   Social History   Socioeconomic History   Marital status: Widowed    Spouse name: Not on file   Number of children: 2   Years of education: 78   Highest education level: High school graduate  Occupational History   Not on file  Tobacco Use   Smoking status: Former    Current packs/day: 0.00    Average packs/day: 1 pack/day for 25.0 years (25.0 ttl pk-yrs)    Types: Cigarettes    Start date: 05/08/1954    Quit date: 05/09/1979    Years since quitting: 45.3   Smokeless tobacco: Former    Types: Chew    Quit date: 11/22/1988  Vaping Use   Vaping status: Never Used  Substance and Sexual Activity   Alcohol use: Yes    Alcohol/week: 5.0 standard drinks of alcohol    Types: 5 Cans of beer  per week    Comment: 5 beers per week   Drug use: Never   Sexual activity: Not Currently  Other Topics Concern   Not on file  Social History Narrative   Lives at home alone.   Right-handed.   No daily use of caffeine.    Social Drivers of Corporate investment banker Strain: Not on file  Food Insecurity: Unknown (07/01/2023)   Hunger Vital Sign    Worried About Running Out of Food in the Last Year: Not on file    Ran Out of Food in the Last Year: Never true  Transportation Needs: No Transportation Needs (07/01/2023)   PRAPARE - Administrator, Civil Service (Medical): No    Lack of Transportation (Non-Medical): No  Physical Activity: Not on file  Stress: Not on file  Social Connections: Socially Isolated (07/01/2023)   Social Connection and Isolation Panel    Frequency of Communication with Friends and Family: More than three times a week    Frequency of Social Gatherings with Friends and Family: More than three times a week    Attends Religious Services: Never    Database administrator or Organizations: No    Attends Banker Meetings: Never    Marital Status: Widowed     Family History: The patient's family history includes COPD in his father; Heart attack in his mother; Hypertension in his father and mother.  ROS:   ROS Please see the history of present illness.     All other systems reviewed and are negative.  EKGs/Labs/Other Studies Reviewed:    The following studies were reviewed today:   Cardiac Studies & Procedures   ______________________________________________________________________________________________     ECHOCARDIOGRAM  ECHOCARDIOGRAM COMPLETE 06/17/2023  Narrative ECHOCARDIOGRAM REPORT    Patient Name:   AARIZ MAISH Date of Exam: 06/17/2023 Medical Rec #:  [id number]     Height:       67.0 in Accession #:    [id number]    Weight:       185.5 lb Date of Birth:  [birth year]      BSA:          1.959 m Patient Age:  89  years      BP:           128/67 mmHg Patient Gender: M             HR:           59 bpm. Exam Location:  Inpatient  Procedure: 2D Echo, Cardiac Doppler, Color Doppler and Intracardiac Opacification Agent  Indications:    Stroke  History:        Patient has no prior history of Echocardiogram examinations. Risk Factors:Hypertension, Dyslipidemia, Sleep Apnea and Former Smoker.  Sonographer:    Logan Shove Referring Phys: [id number] CORTNEY E DE LA TORRE   Sonographer Comments: Image acquisition challenging due to patient body habitus. IMPRESSIONS   1. Left ventricular ejection fraction, by estimation, is 60 to 65%. The left ventricle has normal function. The left ventricle has no regional wall motion abnormalities. 2. Right ventricular systolic function is normal. The right ventricular size is normal. 3. The mitral valve is normal in structure. Mild mitral valve regurgitation. 4. The aortic valve is tricuspid. Aortic valve regurgitation is mild. Aortic valve sclerosis is present, with no evidence of aortic valve stenosis. 5. The inferior vena cava is normal in size with greater than 50% respiratory variability, suggesting right atrial pressure of 3 mmHg.  FINDINGS Left Ventricle: Left ventricular ejection fraction, by estimation, is 60 to 65%. The left ventricle has normal function. The left ventricle has no regional wall motion abnormalities. Definity  contrast agent was given IV to delineate the left ventricular endocardial borders. The left ventricular internal cavity size was normal in size. There is no left ventricular hypertrophy.  Right Ventricle: The right ventricular size is normal. Right vetricular wall thickness was not assessed. Right ventricular systolic function is normal.  Left Atrium: Left atrial size was normal in size.  Right Atrium: Right atrial size was normal in size.  Pericardium: Trivial pericardial effusion is present.  Mitral Valve: The mitral valve is normal  in structure. Mild mitral valve regurgitation.  Tricuspid Valve: The tricuspid valve is normal in structure. Tricuspid valve regurgitation is not demonstrated.  Aortic Valve: The aortic valve is tricuspid. Aortic valve regurgitation is mild. Aortic regurgitation PHT measures 514 msec. Aortic valve sclerosis is present, with no evidence of aortic valve stenosis. Aortic valve peak gradient measures 6.2 mmHg.  Pulmonic Valve: The pulmonic valve was normal in structure. Pulmonic valve regurgitation is not visualized.  Aorta: The aortic root is normal in size and structure.  Venous: The inferior vena cava is normal in size with greater than 50% respiratory variability, suggesting right atrial pressure of 3 mmHg.  IAS/Shunts: No atrial level shunt detected by color flow Doppler.   LEFT VENTRICLE PLAX 2D LVIDd:         5.00 cm      Diastology LVIDs:         3.20 cm      LV e' medial:    3.92 cm/s LV PW:         1.00 cm      LV E/e' medial:  26.3 LV IVS:        1.00 cm      LV e' lateral:   6.20 cm/s LVOT diam:     2.00 cm      LV E/e' lateral: 16.6 LV SV:         72 LV SV Index:   37 LVOT Area:     3.14 cm  LV Volumes (MOD) LV vol d,  MOD A2C: 127.0 ml LV vol d, MOD A4C: 103.0 ml LV vol s, MOD A2C: 43.1 ml LV vol s, MOD A4C: 37.1 ml LV SV MOD A2C:     83.9 ml LV SV MOD A4C:     103.0 ml LV SV MOD BP:      77.4 ml  RIGHT VENTRICLE             IVC RV Basal diam:  3.10 cm     IVC diam: 1.20 cm RV S prime:     16.50 cm/s TAPSE (M-mode): 3.9 cm  LEFT ATRIUM             Index        RIGHT ATRIUM           Index LA diam:        3.90 cm 1.99 cm/m   RA Area:     13.10 cm LA Vol (A2C):   67.0 ml 34.21 ml/m  RA Volume:   28.30 ml  14.45 ml/m LA Vol (A4C):   47.3 ml 24.15 ml/m LA Biplane Vol: 57.0 ml 29.10 ml/m AORTIC VALVE AV Area (Vmax): 2.29 cm AV Vmax:        124.00 cm/s AV Peak Grad:   6.2 mmHg LVOT Vmax:      90.50 cm/s LVOT Vmean:     58.600 cm/s LVOT VTI:       0.230  m AI PHT:         514 msec  AORTA Ao Root diam: 3.20 cm Ao Asc diam:  3.80 cm  MITRAL VALVE MV Area (PHT): 3.77 cm     SHUNTS MV Decel Time: 201 msec     Systemic VTI:  0.23 m MV E velocity: 103.00 cm/s  Systemic Diam: 2.00 cm MV A velocity: 86.90 cm/s MV E/A ratio:  1.19  Vina Gull MD Electronically signed by Vina Gull MD Signature Date/Time: 06/17/2023/12:17:25 PM    Final          ______________________________________________________________________________________________     EKG Interpretation Date/Time:  Wednesday September 05 2024 14:50:25 EDT Ventricular Rate:  64 PR Interval:  238 QRS Duration:  90 QT Interval:  428 QTC Calculation: 441 R Axis:   -70  Text Interpretation: Sinus rhythm with 1st degree A-V block Left axis deviation When compared with ECG of 02-Mar-2018 15:42, PR interval has increased QRS axis Shifted left Anterior infarct is now Present Anterolateral infarct is now Present Confirmed by Monetta Rogue ([id number]) on 09/05/2024 3:38:00 PM   Recent Labs: No results found for requested labs within last 365 days.  Recent Lipid Panel    Component Value Date/Time   CHOL 117 06/16/2023 1546   TRIG 41 06/16/2023 1546   HDL 42 06/16/2023 1546   CHOLHDL 2.8 06/16/2023 1546   VLDL 8 06/16/2023 1546   LDLCALC 67 06/16/2023 1546    Physical Exam:    VS:  BP 110/72   Pulse 64   Ht 5' 9 (1.753 m)   Wt 173 lb 3.2 oz (78.6 kg)   SpO2 97%   BMI 25.58 kg/m     Wt Readings from Last 3 Encounters:  09/05/24 173 lb 3.2 oz (78.6 kg)  08/13/21 185 lb 8 oz (84.1 kg)  03/14/18 186 lb (84.4 kg)     GEN: He appears frail well nourished, well developed in no acute distress HEENT: Normal NECK: No JVD; No carotid bruits LYMPHATICS: No lymphadenopathy CARDIAC: RRR, no murmurs, rubs, gallops RESPIRATORY:  Clear  to auscultation without rales, wheezing or rhonchi  ABDOMEN: Soft, non-tender, non-distended MUSCULOSKELETAL:  No edema; No deformity  SKIN:  Warm and dry NEUROLOGIC:  Alert and oriented x 3 PSYCHIATRIC:  Normal affect     Signed, Redell Leiter, MD  09/05/2024 3:38 PM    [facility]

## 2024-09-04 DIAGNOSIS — H9190 Unspecified hearing loss, unspecified ear: Secondary | ICD-10-CM | POA: Insufficient documentation

## 2024-09-04 DIAGNOSIS — E7800 Pure hypercholesterolemia, unspecified: Secondary | ICD-10-CM | POA: Insufficient documentation

## 2024-09-04 DIAGNOSIS — N400 Enlarged prostate without lower urinary tract symptoms: Secondary | ICD-10-CM | POA: Insufficient documentation

## 2024-09-04 DIAGNOSIS — M48061 Spinal stenosis, lumbar region without neurogenic claudication: Secondary | ICD-10-CM | POA: Insufficient documentation

## 2024-09-04 DIAGNOSIS — K219 Gastro-esophageal reflux disease without esophagitis: Secondary | ICD-10-CM | POA: Insufficient documentation

## 2024-09-04 DIAGNOSIS — G4730 Sleep apnea, unspecified: Secondary | ICD-10-CM | POA: Insufficient documentation

## 2024-09-04 DIAGNOSIS — M1990 Unspecified osteoarthritis, unspecified site: Secondary | ICD-10-CM | POA: Insufficient documentation

## 2024-09-04 DIAGNOSIS — I10 Essential (primary) hypertension: Secondary | ICD-10-CM | POA: Insufficient documentation

## 2024-09-04 DIAGNOSIS — M545 Low back pain, unspecified: Secondary | ICD-10-CM | POA: Insufficient documentation

## 2024-09-04 DIAGNOSIS — Z87442 Personal history of urinary calculi: Secondary | ICD-10-CM | POA: Insufficient documentation

## 2024-09-05 ENCOUNTER — Encounter: Payer: Self-pay | Admitting: Cardiology

## 2024-09-05 ENCOUNTER — Ambulatory Visit: Attending: Cardiology | Admitting: Cardiology

## 2024-09-05 VITALS — BP 110/72 | HR 64 | Ht 69.0 in | Wt 173.2 lb

## 2024-09-05 DIAGNOSIS — E782 Mixed hyperlipidemia: Secondary | ICD-10-CM

## 2024-09-05 DIAGNOSIS — I10 Essential (primary) hypertension: Secondary | ICD-10-CM

## 2024-09-05 DIAGNOSIS — R079 Chest pain, unspecified: Secondary | ICD-10-CM

## 2024-09-05 MED ORDER — NITROGLYCERIN 0.4 MG SL SUBL: 0.4000 mg | SUBLINGUAL_TABLET | SUBLINGUAL | 1 refills | Status: AC | PRN

## 2024-09-05 NOTE — Patient Instructions (Signed)
 Medication Instructions:  Your physician has recommended you make the following change in your medication:   START: Nitroglycerin 0.4 mg under the tongue every 5 minutes x 3 doses as needed for chest pain  *If you need a refill on your cardiac medications before your next appointment, please call your pharmacy*  Lab Work: None If you have labs (blood work) drawn today and your tests are completely normal, you will receive your results only by: MyChart Message (if you have MyChart) OR A paper copy in the mail If you have any lab test that is abnormal or we need to change your treatment, we will call you to review the results.  Testing/Procedures: None  Follow-Up: At [facility], you and your health needs are our priority.  As part of our continuing mission to provide you with exceptional heart care, our providers are all part of one team.  This team includes your primary Cardiologist (physician) and Advanced Practice Providers or APPs (Physician Assistants and Nurse Practitioners) who all work together to provide you with the care you need, when you need it.  Your next appointment:   1 year(s)  Provider:   Redell Leiter, MD    We recommend signing up for the patient portal called MyChart.  Sign up information is provided on this After Visit Summary.  MyChart is used to connect with patients for Virtual Visits (Telemedicine).  Patients are able to view lab/test results, encounter notes, upcoming appointments, etc.  Non-urgent messages can be sent to your provider as well.   To learn more about what you can do with MyChart, go to [email or web address].   Other Instructions None

## 2024-09-06 ENCOUNTER — Ambulatory Visit: Admitting: Podiatry

## 2024-09-06 ENCOUNTER — Telehealth: Payer: Self-pay

## 2024-09-06 NOTE — Telephone Encounter (Signed)
 Called the patient and informed him of the instructions below for his Lexiscan  stress test. Patient verbalized understanding and had no further questions at this time. A copy of these instruction was left at the front desk for the patient to pick - up.   [facility] [address] [place], [place] [address] Phone:  [phone]    Please arrive 15 minutes prior to your appointment time for registration and insurance purposes.  The test will take approximately 3 to 4 hours to complete; you may bring reading material.  If someone comes with you to your appointment, they will need to remain in the main lobby due to limited space in the testing area. **If you are pregnant or breastfeeding, please notify the nuclear lab prior to your appointment**  How to prepare for your Myocardial Perfusion Test: Do not eat or drink 3 hours prior to your test, except you may have water. Do not consume products containing caffeine (regular or decaffeinated) 12 hours prior to your test. (ex: coffee, chocolate, sodas, tea). Do bring a list of your current medications with you.  If not listed below, you may take your medications as normal. Do not take metoprolol  (Lopressor , Toprol ) for 24 hours prior to the test.  Bring the medication to your appointment as you may be required to take it once the test is complete. Do wear comfortable clothes (no dresses or overalls) and walking shoes, tennis shoes preferred (No heels or open toe shoes are allowed). Do NOT wear cologne, perfume, aftershave, or lotions (deodorant is allowed). If these instructions are not followed, your test will have to be rescheduled.  Please report to [address] for your test.  If you have questions or concerns about your appointment, you can call the [facility]  Nuclear Imaging Lab at [phone].  If you cannot keep your appointment, please provide 24 hours notification to the Nuclear Lab, to  avoid a possible $50 charge to your account.

## 2024-09-13 DIAGNOSIS — J849 Interstitial pulmonary disease, unspecified: Secondary | ICD-10-CM | POA: Diagnosis not present

## 2024-09-13 DIAGNOSIS — I10 Essential (primary) hypertension: Secondary | ICD-10-CM | POA: Diagnosis not present

## 2024-09-13 DIAGNOSIS — R0600 Dyspnea, unspecified: Secondary | ICD-10-CM | POA: Diagnosis not present

## 2024-09-13 DIAGNOSIS — R079 Chest pain, unspecified: Secondary | ICD-10-CM | POA: Diagnosis not present

## 2024-09-13 DIAGNOSIS — E785 Hyperlipidemia, unspecified: Secondary | ICD-10-CM | POA: Diagnosis not present

## 2024-09-19 ENCOUNTER — Ambulatory Visit: Admitting: Podiatry

## 2024-09-19 DIAGNOSIS — M79675 Pain in left toe(s): Secondary | ICD-10-CM | POA: Diagnosis not present

## 2024-09-19 DIAGNOSIS — M79674 Pain in right toe(s): Secondary | ICD-10-CM | POA: Diagnosis not present

## 2024-09-19 DIAGNOSIS — B351 Tinea unguium: Secondary | ICD-10-CM | POA: Diagnosis not present

## 2024-09-19 NOTE — Progress Notes (Unsigned)
 Subjective:  Patient ID: [name], male    DOB: July 05, 1933,  MRN: [id number]  [name] presents to clinic today for:  Chief Complaint  Patient presents with   [facility] - [place]    RFC with out callous Not diabetic, ASA   Patient notes nails are thick, discolored, elongated and painful in shoegear when trying to ambulate.    PCP is Keren Vicenta BRAVO, MD.  Past Medical History:  Diagnosis Date   Acid reflux    Acquired pes planus of both feet 08/26/2016   Ankle arthritis 05/06/2016   Arthritis    right ankle (05/11/2013)   Cerebrovascular accident (CVA) (HCC) 08/13/2021   Cervical radiculitis 12/23/2015   Cervical spondylosis with myelopathy 10/10/2013   Chronic lower back pain    Degeneration of intervertebral disc of lumbosacral region 04/28/2013   Degenerative lumbar spinal stenosis 09/04/2024   Diplopia 08/13/2021   Disease of spinal cord (HCC) 10/10/2013   Dislocation of left knee with lateral meniscus tear 06/03/2016   Enlarged prostate    Gonalgia 02/24/2015   H/O total ankle replacement, left 03/14/2018   Hammertoe of left foot 12/20/2016   Hard of hearing    Hip pain 04/01/2017   History of arthroscopy of knee 03/31/2015   History of kidney stones    History of stroke 09/14/2023   Hypercholesteremia    Hypertension    Ingrowing nail 01/28/2016   Ingrown nail of great toe of left foot 01/28/2016   Inguinal pain 09/08/2017   Knee torn cartilage 03/13/2015   Left knee pain 02/24/2015   Lumbar radiculopathy 06/17/2022   Metatarsalgia, left foot 12/20/2016   Mild cognitive impairment 08/13/2021   Neck pain 05/14/2015   Plantar fat pad atrophy of left foot 12/20/2016   Ptosis, both eyelids 11/13/2014   S/P left knee arthroscopy 03/31/2015   Sleep apnea    had study; then OR; never wore mask (05/11/2013)   Spinal stenosis in cervical region 10/10/2013   Stroke [facility]) 1979   fully recovered since then (05/11/2013)   Tear of meniscus of left knee 03/13/2015    Vertigo    present intermittently for 40+ years   Past Surgical History:  Procedure Laterality Date   ANKLE FUSION Right 05/10/2013   Procedure: ARTHRODESIS ANKLE;  Surgeon: Norleen Armor, MD;  Location: [facility] OR;  Service: Orthopedics;  Laterality: Right;   BACK SURGERY  11/22/2009   CARPAL TUNNEL RELEASE Right 04/15/2010   LITHOTRIPSY  ~ 2009   NASAL SINUS SURGERY Bilateral 11/22/2002   POSTERIOR LAMINECTOMY / DECOMPRESSION LUMBAR SPINE  11/22/2009   Dr. Amy   REPAIR OF PERFORATED ULCER  11/22/1977   STOMACH SURGERY     TENDON RELEASE Right 05/10/2013   Procedure: PERCUTANEOUS ACHILLES LENGTHENING;  Surgeon: Norleen Armor, MD;  Location: [facility] OR;  Service: Orthopedics;  Laterality: Right;   TONSILLECTOMY  07/24/1939   TOTAL ANKLE ARTHROPLASTY Left 03/14/2018   Procedure: TOTAL ANKLE ARTHOPLASTY;  Surgeon: Armor Norleen, MD;  Location: [facility] OR;  Service: Orthopedics;  Laterality: Left;   No Known Allergies  Review of Systems: Negative except as noted in the HPI.  Objective:  BRAETON Raymond is a pleasant 89 y.o. male in NAD. AAO x 3.  Vascular Examination: Capillary refill time is 3-5 seconds to toes bilateral. Palpable pedal pulses b/l LE. Digital hair present b/l.  Skin temperature gradient WNL b/l. No varicosities b/l. No cyanosis noted b/l.   Dermatological Examination: Pedal skin with normal turgor, texture and tone  b/l. No open wounds. No interdigital macerations b/l. Toenails x10 are 3mm thick, discolored, dystrophic with subungual debris. There is pain with compression of the nail plates.  They are elongated x10  Assessment/Plan: 1. Pain due to onychomycosis of toenails of both feet    The mycotic toenails were sharply debrided x10 with sterile nail nippers and a power debriding burr to decrease bulk/thickness and length.    Return in about 3 months (around 12/20/2024) for RFC.   Awanda CHARM [place], DPM, FACFAS [facility]     [address] [place], [place] [address]                Office [phone]  Fax [phone]

## 2024-12-20 ENCOUNTER — Ambulatory Visit: Admitting: Podiatry

## 2024-12-20 DIAGNOSIS — B351 Tinea unguium: Secondary | ICD-10-CM | POA: Diagnosis not present

## 2024-12-20 DIAGNOSIS — M79675 Pain in left toe(s): Secondary | ICD-10-CM | POA: Diagnosis not present

## 2024-12-20 DIAGNOSIS — M79674 Pain in right toe(s): Secondary | ICD-10-CM

## 2024-12-20 NOTE — Progress Notes (Signed)
 "     Subjective:  Patient ID: [name], male    DOB: June 02, 1933,  MRN: [id number]  [name] presents to clinic today for:  Chief Complaint  Patient presents with   RFC    RFC no callus, not diabetic and takes ASA   Patient notes nails are thick, discolored, elongated and painful in shoegear when trying to ambulate.    PCP is Keren Vicenta BRAVO, MD.  Past Medical History:  Diagnosis Date   Acid reflux    Acquired pes planus of both feet 08/26/2016   Ankle arthritis 05/06/2016   Arthritis    right ankle (05/11/2013)   Cerebrovascular accident (CVA) (HCC) 08/13/2021   Cervical radiculitis 12/23/2015   Cervical spondylosis with myelopathy 10/10/2013   Chronic lower back pain    Degeneration of intervertebral disc of lumbosacral region 04/28/2013   Degenerative lumbar spinal stenosis 09/04/2024   Diplopia 08/13/2021   Disease of spinal cord (HCC) 10/10/2013   Dislocation of left knee with lateral meniscus tear 06/03/2016   Enlarged prostate    Gonalgia 02/24/2015   H/O total ankle replacement, left 03/14/2018   Hammertoe of left foot 12/20/2016   Hard of hearing    Hip pain 04/01/2017   History of arthroscopy of knee 03/31/2015   History of kidney stones    History of stroke 09/14/2023   Hypercholesteremia    Hypertension    Ingrowing nail 01/28/2016   Ingrown nail of great toe of left foot 01/28/2016   Inguinal pain 09/08/2017   Knee torn cartilage 03/13/2015   Left knee pain 02/24/2015   Lumbar radiculopathy 06/17/2022   Metatarsalgia, left foot 12/20/2016   Mild cognitive impairment 08/13/2021   Neck pain 05/14/2015   Plantar fat pad atrophy of left foot 12/20/2016   Ptosis, both eyelids 11/13/2014   S/P left knee arthroscopy 03/31/2015   Sleep apnea    had study; then OR; never wore mask (05/11/2013)   Spinal stenosis in cervical region 10/10/2013   Stroke [facility] 1979   fully recovered since then (05/11/2013)   Tear of meniscus of left knee  03/13/2015   Vertigo    present intermittently for 40+ years   Past Surgical History:  Procedure Laterality Date   ANKLE FUSION Right 05/10/2013   Procedure: ARTHRODESIS ANKLE;  Surgeon: Norleen Armor, MD;  Location: [facility];  Service: Orthopedics;  Laterality: Right;   BACK SURGERY  11/22/2009   CARPAL TUNNEL RELEASE Right 04/15/2010   LITHOTRIPSY  ~ 2009   NASAL SINUS SURGERY Bilateral 11/22/2002   POSTERIOR LAMINECTOMY / DECOMPRESSION LUMBAR SPINE  11/22/2009   Dr. Amy   REPAIR OF PERFORATED ULCER  11/22/1977   STOMACH SURGERY     TENDON RELEASE Right 05/10/2013   Procedure: PERCUTANEOUS ACHILLES LENGTHENING;  Surgeon: Norleen Armor, MD;  Location: [facility] OR;  Service: Orthopedics;  Laterality: Right;   TONSILLECTOMY  07/24/1939   TOTAL ANKLE ARTHROPLASTY Left 03/14/2018   Procedure: TOTAL ANKLE ARTHOPLASTY;  Surgeon: Armor Norleen, MD;  Location: [facility] OR;  Service: Orthopedics;  Laterality: Left;   No Known Allergies  Review of Systems: Negative except as noted in the HPI.  Objective:  [name] is a pleasant 89 y.o. male in NAD. AAO x 3.  Vascular Examination: Capillary refill time is 3-5 seconds to toes bilateral. Palpable pedal pulses b/l LE. Digital hair present b/l.  Skin temperature gradient WNL b/l. No varicosities b/l. No cyanosis noted b/l.   Dermatological Examination: Pedal skin with normal  turgor, texture and tone b/l. No open wounds. No interdigital macerations b/l. Toenails x10 are 3mm thick, discolored, dystrophic with subungual debris. There is pain with compression of the nail plates.  They are elongated x10  Assessment/Plan: 1. Pain due to onychomycosis of toenails of both feet    The mycotic toenails were sharply debrided x10 with sterile nail nippers and a power debriding burr to decrease bulk/thickness and length.    Return in about 3 months (around 03/20/2025) for RFC.   Awanda CHARM Imperial, DPM, FACFAS [facility]     [address] [place], [place] [address]                Office [phone]  Fax [phone] "

## 2025-03-21 ENCOUNTER — Ambulatory Visit: Admitting: Podiatry
